# Patient Record
Sex: Male | Born: 1970 | Race: White | Hispanic: No | Marital: Single | Smoking: Current every day smoker
Health system: Southern US, Community
[De-identification: ages and names within clinical notes are randomized; demographics above are authoritative.]

## PROBLEM LIST (undated history)

## (undated) DIAGNOSIS — F32A Depression, unspecified: Secondary | ICD-10-CM

## (undated) DIAGNOSIS — F17200 Nicotine dependence, unspecified, uncomplicated: Secondary | ICD-10-CM

## (undated) DIAGNOSIS — K219 Gastro-esophageal reflux disease without esophagitis: Secondary | ICD-10-CM

## (undated) DIAGNOSIS — F329 Major depressive disorder, single episode, unspecified: Secondary | ICD-10-CM

## (undated) DIAGNOSIS — M549 Dorsalgia, unspecified: Secondary | ICD-10-CM

## (undated) DIAGNOSIS — F419 Anxiety disorder, unspecified: Secondary | ICD-10-CM

## (undated) DIAGNOSIS — G8929 Other chronic pain: Secondary | ICD-10-CM

## (undated) DIAGNOSIS — F319 Bipolar disorder, unspecified: Secondary | ICD-10-CM

## (undated) DIAGNOSIS — K589 Irritable bowel syndrome without diarrhea: Secondary | ICD-10-CM

## (undated) DIAGNOSIS — K5090 Crohn's disease, unspecified, without complications: Secondary | ICD-10-CM

## (undated) DIAGNOSIS — Z8719 Personal history of other diseases of the digestive system: Secondary | ICD-10-CM

## (undated) DIAGNOSIS — R109 Unspecified abdominal pain: Secondary | ICD-10-CM

## (undated) DIAGNOSIS — F1910 Other psychoactive substance abuse, uncomplicated: Secondary | ICD-10-CM

## (undated) DIAGNOSIS — R0602 Shortness of breath: Secondary | ICD-10-CM

## (undated) HISTORY — DX: Nicotine dependence, unspecified, uncomplicated: F17.200

## (undated) HISTORY — DX: Bipolar disorder, unspecified: F31.9

## (undated) HISTORY — PX: OTHER SURGICAL HISTORY: SHX169

## (undated) HISTORY — DX: Other psychoactive substance abuse, uncomplicated: F19.10

## (undated) HISTORY — PX: HERNIA REPAIR: SHX51

## (undated) HISTORY — DX: Unspecified abdominal pain: R10.9

## (undated) HISTORY — DX: Dorsalgia, unspecified: M54.9

## (undated) HISTORY — DX: Personal history of other diseases of the digestive system: Z87.19

---

## 2000-02-08 ENCOUNTER — Inpatient Hospital Stay (HOSPITAL_COMMUNITY): Admission: EM | Admit: 2000-02-08 | Discharge: 2000-02-11 | Payer: Self-pay | Admitting: *Deleted

## 2000-04-04 ENCOUNTER — Emergency Department (HOSPITAL_COMMUNITY): Admission: EM | Admit: 2000-04-04 | Discharge: 2000-04-04 | Payer: Self-pay | Admitting: Emergency Medicine

## 2001-02-09 ENCOUNTER — Encounter: Payer: Self-pay | Admitting: *Deleted

## 2001-02-09 ENCOUNTER — Inpatient Hospital Stay (HOSPITAL_COMMUNITY): Admission: EM | Admit: 2001-02-09 | Discharge: 2001-02-11 | Payer: Self-pay | Admitting: *Deleted

## 2001-02-19 ENCOUNTER — Encounter: Payer: Self-pay | Admitting: *Deleted

## 2001-02-19 ENCOUNTER — Inpatient Hospital Stay (HOSPITAL_COMMUNITY): Admission: EM | Admit: 2001-02-19 | Discharge: 2001-02-24 | Payer: Self-pay | Admitting: *Deleted

## 2001-02-20 ENCOUNTER — Encounter: Payer: Self-pay | Admitting: Urology

## 2001-02-21 ENCOUNTER — Encounter: Payer: Self-pay | Admitting: Urology

## 2001-02-24 ENCOUNTER — Encounter: Payer: Self-pay | Admitting: Urology

## 2001-10-02 ENCOUNTER — Encounter: Payer: Self-pay | Admitting: *Deleted

## 2001-10-02 ENCOUNTER — Emergency Department (HOSPITAL_COMMUNITY): Admission: EM | Admit: 2001-10-02 | Discharge: 2001-10-02 | Payer: Self-pay | Admitting: *Deleted

## 2002-02-08 ENCOUNTER — Emergency Department (HOSPITAL_COMMUNITY): Admission: EM | Admit: 2002-02-08 | Discharge: 2002-02-08 | Payer: Self-pay | Admitting: *Deleted

## 2002-02-08 ENCOUNTER — Encounter: Payer: Self-pay | Admitting: *Deleted

## 2002-02-08 ENCOUNTER — Emergency Department (HOSPITAL_COMMUNITY): Admission: EM | Admit: 2002-02-08 | Discharge: 2002-02-08 | Payer: Self-pay | Admitting: Emergency Medicine

## 2002-02-08 ENCOUNTER — Encounter: Payer: Self-pay | Admitting: Emergency Medicine

## 2002-02-16 ENCOUNTER — Encounter: Payer: Self-pay | Admitting: Emergency Medicine

## 2002-02-16 ENCOUNTER — Emergency Department (HOSPITAL_COMMUNITY): Admission: EM | Admit: 2002-02-16 | Discharge: 2002-02-16 | Payer: Self-pay | Admitting: Emergency Medicine

## 2002-05-20 ENCOUNTER — Emergency Department (HOSPITAL_COMMUNITY): Admission: EM | Admit: 2002-05-20 | Discharge: 2002-05-21 | Payer: Self-pay | Admitting: *Deleted

## 2002-05-21 ENCOUNTER — Encounter: Payer: Self-pay | Admitting: *Deleted

## 2002-05-22 ENCOUNTER — Encounter: Payer: Self-pay | Admitting: *Deleted

## 2002-05-22 ENCOUNTER — Emergency Department (HOSPITAL_COMMUNITY): Admission: EM | Admit: 2002-05-22 | Discharge: 2002-05-22 | Payer: Self-pay | Admitting: *Deleted

## 2002-05-23 ENCOUNTER — Emergency Department (HOSPITAL_COMMUNITY): Admission: EM | Admit: 2002-05-23 | Discharge: 2002-05-24 | Payer: Self-pay | Admitting: Family Medicine

## 2002-05-23 ENCOUNTER — Encounter: Payer: Self-pay | Admitting: *Deleted

## 2002-07-23 ENCOUNTER — Emergency Department (HOSPITAL_COMMUNITY): Admission: EM | Admit: 2002-07-23 | Discharge: 2002-07-23 | Payer: Self-pay | Admitting: Emergency Medicine

## 2003-02-11 ENCOUNTER — Emergency Department (HOSPITAL_COMMUNITY): Admission: EM | Admit: 2003-02-11 | Discharge: 2003-02-11 | Payer: Self-pay | Admitting: Emergency Medicine

## 2003-09-14 ENCOUNTER — Emergency Department (HOSPITAL_COMMUNITY): Admission: EM | Admit: 2003-09-14 | Discharge: 2003-09-14 | Payer: Self-pay | Admitting: Emergency Medicine

## 2003-09-18 ENCOUNTER — Emergency Department (HOSPITAL_COMMUNITY): Admission: EM | Admit: 2003-09-18 | Discharge: 2003-09-18 | Payer: Self-pay | Admitting: Emergency Medicine

## 2003-11-03 ENCOUNTER — Emergency Department (HOSPITAL_COMMUNITY): Admission: EM | Admit: 2003-11-03 | Discharge: 2003-11-03 | Payer: Self-pay | Admitting: *Deleted

## 2003-12-20 ENCOUNTER — Inpatient Hospital Stay (HOSPITAL_COMMUNITY): Admission: EM | Admit: 2003-12-20 | Discharge: 2003-12-22 | Payer: Self-pay | Admitting: Emergency Medicine

## 2003-12-24 ENCOUNTER — Emergency Department (HOSPITAL_COMMUNITY): Admission: EM | Admit: 2003-12-24 | Discharge: 2003-12-25 | Payer: Self-pay | Admitting: Emergency Medicine

## 2003-12-26 ENCOUNTER — Emergency Department (HOSPITAL_COMMUNITY): Admission: EM | Admit: 2003-12-26 | Discharge: 2003-12-26 | Payer: Self-pay | Admitting: Emergency Medicine

## 2004-01-28 ENCOUNTER — Emergency Department (HOSPITAL_COMMUNITY): Admission: EM | Admit: 2004-01-28 | Discharge: 2004-01-28 | Payer: Self-pay | Admitting: *Deleted

## 2004-03-14 ENCOUNTER — Emergency Department (HOSPITAL_COMMUNITY): Admission: EM | Admit: 2004-03-14 | Discharge: 2004-03-14 | Payer: Self-pay | Admitting: *Deleted

## 2004-08-27 ENCOUNTER — Emergency Department (HOSPITAL_COMMUNITY): Admission: EM | Admit: 2004-08-27 | Discharge: 2004-08-27 | Payer: Self-pay | Admitting: Emergency Medicine

## 2004-10-21 ENCOUNTER — Emergency Department (HOSPITAL_COMMUNITY): Admission: EM | Admit: 2004-10-21 | Discharge: 2004-10-21 | Payer: Self-pay | Admitting: Emergency Medicine

## 2005-08-31 ENCOUNTER — Emergency Department (HOSPITAL_COMMUNITY): Admission: EM | Admit: 2005-08-31 | Discharge: 2005-08-31 | Payer: Self-pay | Admitting: Emergency Medicine

## 2005-09-17 ENCOUNTER — Emergency Department (HOSPITAL_COMMUNITY): Admission: EM | Admit: 2005-09-17 | Discharge: 2005-09-18 | Payer: Self-pay | Admitting: Emergency Medicine

## 2005-09-27 ENCOUNTER — Emergency Department (HOSPITAL_COMMUNITY): Admission: EM | Admit: 2005-09-27 | Discharge: 2005-09-27 | Payer: Self-pay | Admitting: Emergency Medicine

## 2005-11-18 ENCOUNTER — Emergency Department (HOSPITAL_COMMUNITY): Admission: EM | Admit: 2005-11-18 | Discharge: 2005-11-18 | Payer: Self-pay | Admitting: Emergency Medicine

## 2005-12-12 ENCOUNTER — Emergency Department (HOSPITAL_COMMUNITY): Admission: EM | Admit: 2005-12-12 | Discharge: 2005-12-12 | Payer: Self-pay | Admitting: Emergency Medicine

## 2006-06-30 ENCOUNTER — Emergency Department (HOSPITAL_COMMUNITY): Admission: EM | Admit: 2006-06-30 | Discharge: 2006-07-01 | Payer: Self-pay | Admitting: Emergency Medicine

## 2006-07-03 ENCOUNTER — Emergency Department (HOSPITAL_COMMUNITY): Admission: EM | Admit: 2006-07-03 | Discharge: 2006-07-03 | Payer: Self-pay | Admitting: Emergency Medicine

## 2006-08-23 ENCOUNTER — Inpatient Hospital Stay (HOSPITAL_COMMUNITY): Admission: EM | Admit: 2006-08-23 | Discharge: 2006-08-27 | Payer: Self-pay | Admitting: Emergency Medicine

## 2006-08-26 ENCOUNTER — Ambulatory Visit: Payer: Self-pay | Admitting: Internal Medicine

## 2006-08-29 ENCOUNTER — Ambulatory Visit: Payer: Self-pay | Admitting: Internal Medicine

## 2006-09-10 ENCOUNTER — Ambulatory Visit: Payer: Self-pay | Admitting: Internal Medicine

## 2006-09-24 ENCOUNTER — Ambulatory Visit (HOSPITAL_COMMUNITY): Admission: RE | Admit: 2006-09-24 | Discharge: 2006-09-24 | Payer: Self-pay | Admitting: Internal Medicine

## 2006-10-31 ENCOUNTER — Emergency Department (HOSPITAL_COMMUNITY): Admission: EM | Admit: 2006-10-31 | Discharge: 2006-10-31 | Payer: Self-pay | Admitting: Emergency Medicine

## 2006-11-05 ENCOUNTER — Ambulatory Visit: Payer: Self-pay | Admitting: Internal Medicine

## 2006-12-18 ENCOUNTER — Ambulatory Visit: Payer: Self-pay | Admitting: Internal Medicine

## 2007-03-11 ENCOUNTER — Inpatient Hospital Stay (HOSPITAL_COMMUNITY): Admission: EM | Admit: 2007-03-11 | Discharge: 2007-03-13 | Payer: Self-pay | Admitting: Emergency Medicine

## 2007-03-12 ENCOUNTER — Ambulatory Visit: Payer: Self-pay | Admitting: Gastroenterology

## 2007-03-13 ENCOUNTER — Ambulatory Visit: Payer: Self-pay | Admitting: Internal Medicine

## 2007-04-03 ENCOUNTER — Emergency Department (HOSPITAL_COMMUNITY): Admission: EM | Admit: 2007-04-03 | Discharge: 2007-04-03 | Payer: Self-pay | Admitting: Emergency Medicine

## 2007-04-22 ENCOUNTER — Ambulatory Visit: Payer: Self-pay | Admitting: Family Medicine

## 2007-04-28 DIAGNOSIS — F319 Bipolar disorder, unspecified: Secondary | ICD-10-CM | POA: Insufficient documentation

## 2007-04-28 DIAGNOSIS — F1910 Other psychoactive substance abuse, uncomplicated: Secondary | ICD-10-CM

## 2007-04-28 DIAGNOSIS — M549 Dorsalgia, unspecified: Secondary | ICD-10-CM | POA: Insufficient documentation

## 2007-04-28 DIAGNOSIS — R109 Unspecified abdominal pain: Secondary | ICD-10-CM | POA: Insufficient documentation

## 2007-04-28 DIAGNOSIS — Z8719 Personal history of other diseases of the digestive system: Secondary | ICD-10-CM | POA: Insufficient documentation

## 2007-04-28 DIAGNOSIS — F17200 Nicotine dependence, unspecified, uncomplicated: Secondary | ICD-10-CM

## 2007-05-08 ENCOUNTER — Ambulatory Visit: Payer: Self-pay | Admitting: Family Medicine

## 2007-05-31 ENCOUNTER — Emergency Department (HOSPITAL_COMMUNITY): Admission: EM | Admit: 2007-05-31 | Discharge: 2007-06-01 | Payer: Self-pay | Admitting: Emergency Medicine

## 2007-06-17 ENCOUNTER — Ambulatory Visit: Payer: Self-pay | Admitting: Family Medicine

## 2007-07-26 ENCOUNTER — Emergency Department (HOSPITAL_COMMUNITY): Admission: EM | Admit: 2007-07-26 | Discharge: 2007-07-27 | Payer: Self-pay | Admitting: Emergency Medicine

## 2007-07-29 ENCOUNTER — Emergency Department (HOSPITAL_COMMUNITY): Admission: EM | Admit: 2007-07-29 | Discharge: 2007-07-29 | Payer: Self-pay | Admitting: Emergency Medicine

## 2007-08-12 ENCOUNTER — Ambulatory Visit: Payer: Self-pay | Admitting: Family Medicine

## 2007-08-12 ENCOUNTER — Emergency Department (HOSPITAL_COMMUNITY): Admission: EM | Admit: 2007-08-12 | Discharge: 2007-08-12 | Payer: Self-pay | Admitting: Emergency Medicine

## 2007-08-12 ENCOUNTER — Encounter: Payer: Self-pay | Admitting: Family Medicine

## 2007-08-18 ENCOUNTER — Telehealth: Payer: Self-pay | Admitting: Family Medicine

## 2007-08-19 ENCOUNTER — Encounter: Payer: Self-pay | Admitting: Family Medicine

## 2007-08-27 ENCOUNTER — Encounter: Payer: Self-pay | Admitting: Family Medicine

## 2007-10-20 ENCOUNTER — Telehealth: Payer: Self-pay | Admitting: Family Medicine

## 2007-10-30 ENCOUNTER — Telehealth (INDEPENDENT_AMBULATORY_CARE_PROVIDER_SITE_OTHER): Payer: Self-pay | Admitting: *Deleted

## 2007-10-31 ENCOUNTER — Telehealth: Payer: Self-pay | Admitting: Family Medicine

## 2007-11-03 ENCOUNTER — Encounter: Payer: Self-pay | Admitting: Family Medicine

## 2007-11-04 ENCOUNTER — Encounter: Payer: Self-pay | Admitting: Family Medicine

## 2007-11-10 ENCOUNTER — Emergency Department (HOSPITAL_COMMUNITY): Admission: EM | Admit: 2007-11-10 | Discharge: 2007-11-11 | Payer: Self-pay | Admitting: Emergency Medicine

## 2007-11-11 ENCOUNTER — Encounter: Payer: Self-pay | Admitting: Family Medicine

## 2007-12-01 ENCOUNTER — Ambulatory Visit: Payer: Self-pay | Admitting: Family Medicine

## 2007-12-12 ENCOUNTER — Telehealth: Payer: Self-pay | Admitting: Family Medicine

## 2007-12-12 ENCOUNTER — Encounter: Payer: Self-pay | Admitting: Family Medicine

## 2007-12-15 ENCOUNTER — Telehealth: Payer: Self-pay | Admitting: Family Medicine

## 2007-12-17 ENCOUNTER — Telehealth: Payer: Self-pay | Admitting: Family Medicine

## 2007-12-18 ENCOUNTER — Ambulatory Visit (HOSPITAL_COMMUNITY): Admission: RE | Admit: 2007-12-18 | Discharge: 2007-12-18 | Payer: Self-pay | Admitting: Family Medicine

## 2007-12-19 ENCOUNTER — Emergency Department (HOSPITAL_COMMUNITY): Admission: EM | Admit: 2007-12-19 | Discharge: 2007-12-19 | Payer: Self-pay | Admitting: Emergency Medicine

## 2008-01-22 ENCOUNTER — Encounter: Payer: Self-pay | Admitting: Family Medicine

## 2008-01-23 ENCOUNTER — Encounter: Payer: Self-pay | Admitting: Family Medicine

## 2008-03-31 ENCOUNTER — Ambulatory Visit: Payer: Self-pay | Admitting: Family Medicine

## 2008-09-15 ENCOUNTER — Encounter: Payer: Self-pay | Admitting: Family Medicine

## 2008-09-23 ENCOUNTER — Ambulatory Visit: Payer: Self-pay | Admitting: Family Medicine

## 2008-10-03 DIAGNOSIS — K219 Gastro-esophageal reflux disease without esophagitis: Secondary | ICD-10-CM | POA: Insufficient documentation

## 2008-10-03 DIAGNOSIS — F411 Generalized anxiety disorder: Secondary | ICD-10-CM

## 2008-10-07 ENCOUNTER — Telehealth: Payer: Self-pay | Admitting: Family Medicine

## 2008-10-08 ENCOUNTER — Telehealth: Payer: Self-pay | Admitting: Family Medicine

## 2008-10-19 ENCOUNTER — Telehealth: Payer: Self-pay | Admitting: Family Medicine

## 2008-11-08 ENCOUNTER — Telehealth: Payer: Self-pay | Admitting: Family Medicine

## 2008-11-08 ENCOUNTER — Ambulatory Visit: Payer: Self-pay | Admitting: Family Medicine

## 2008-11-16 ENCOUNTER — Telehealth: Payer: Self-pay | Admitting: Family Medicine

## 2009-01-27 ENCOUNTER — Ambulatory Visit: Payer: Self-pay | Admitting: Family Medicine

## 2009-02-13 IMAGING — CR DG CHEST 2V
2 series · 2 of 2 positions shown · non-contrast
Comparison: 10/21/2004

CLINICAL DATA: Left chest pain, fell from ladder last week, pain
with breathing

CHEST - 2 VIEW

[view not recorded (1 of 2)]
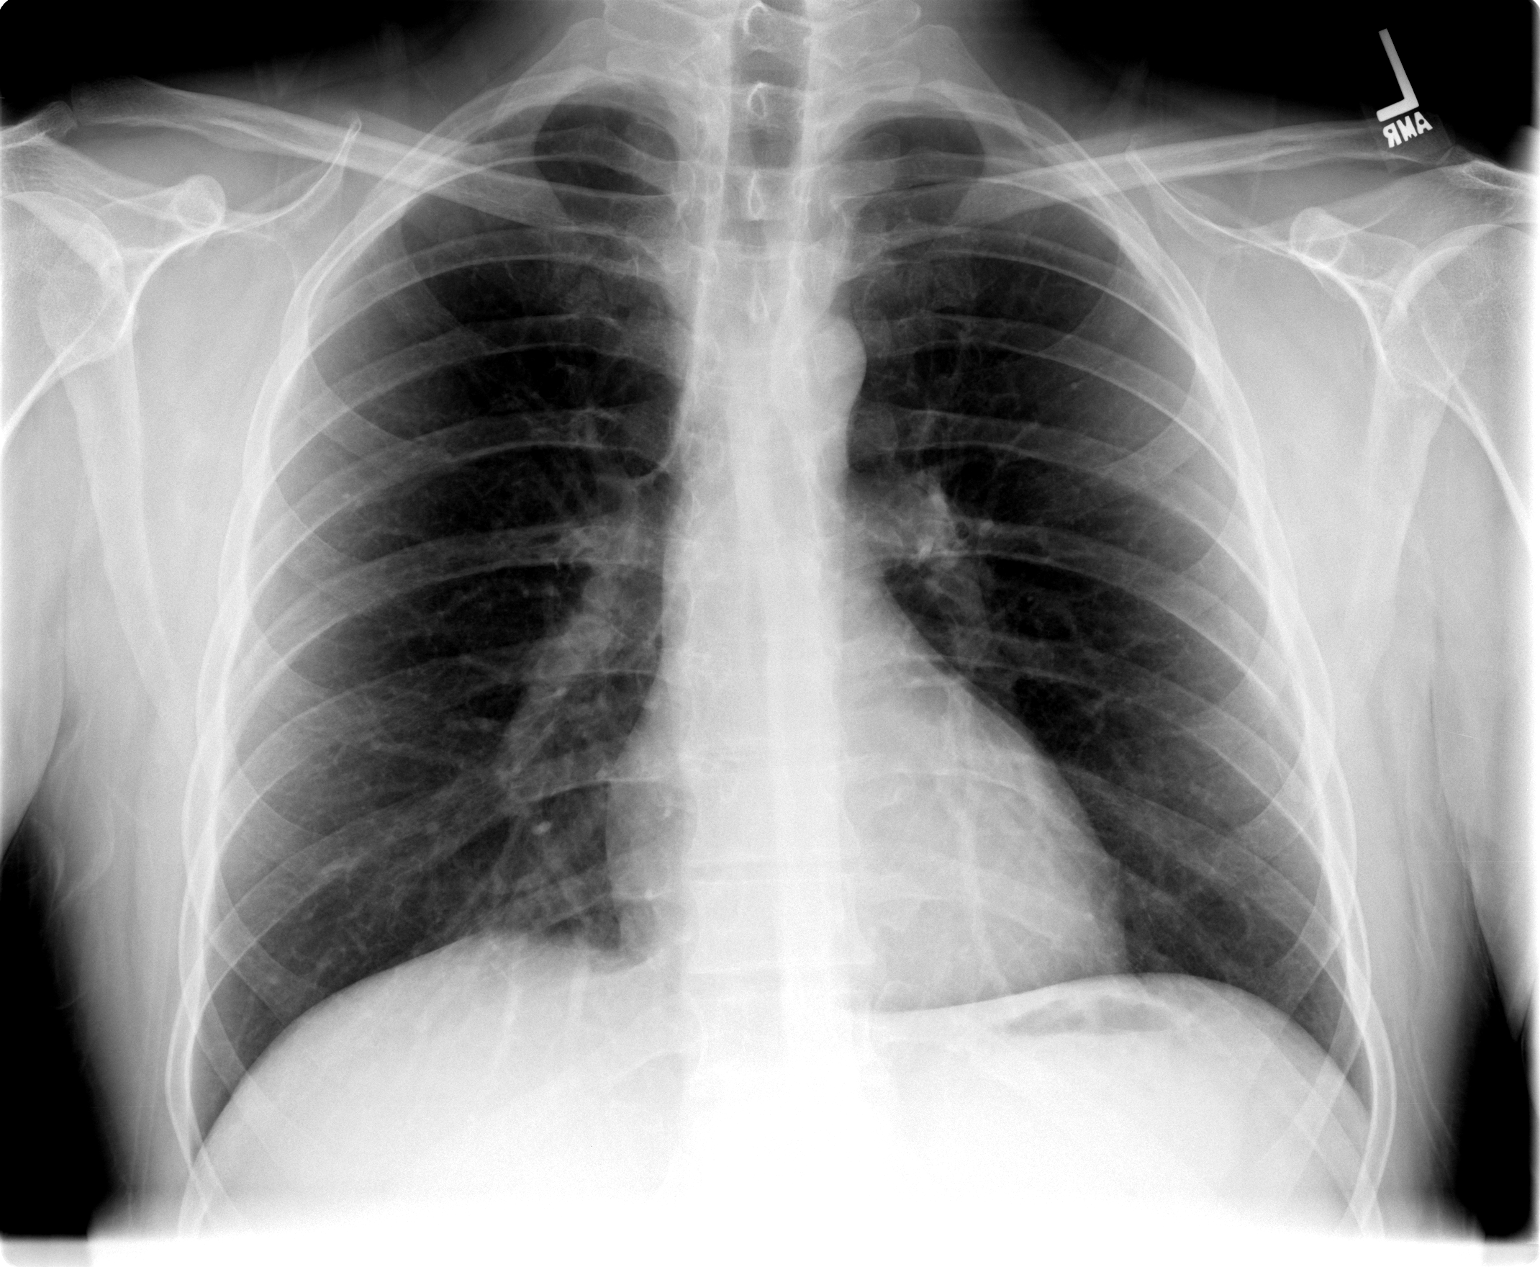

[view not recorded (2 of 2)]
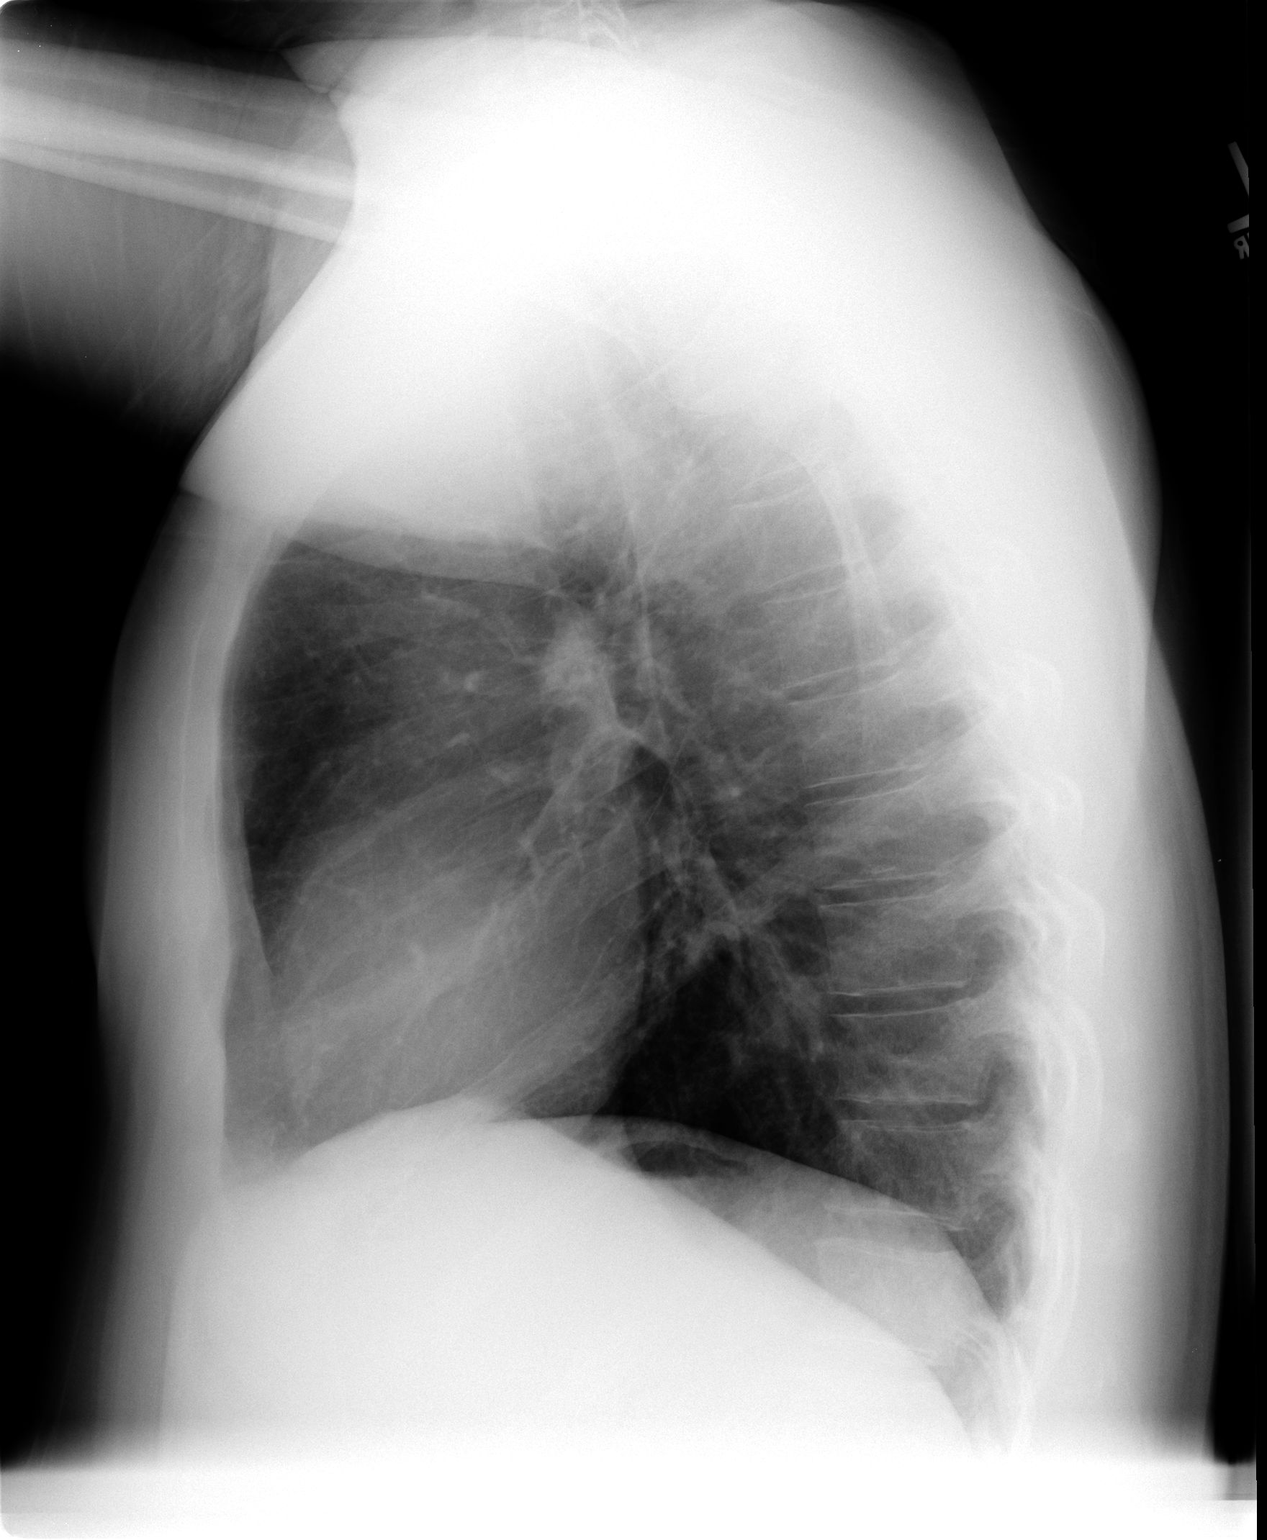

[2 of 2 positions shown; findings below may reference images not displayed]

FINDINGS: Normal heart size, mediastinal contours, and pulmonary vascularity.
Bronchitic changes without pulmonary infiltrate or pleural
effusion.
No pneumothorax.
No definite fractures identified.
IMPRESSION: Mild chronic bronchitic changes.
No acute abnormalities.

## 2009-02-25 ENCOUNTER — Encounter: Payer: Self-pay | Admitting: Family Medicine

## 2009-07-20 ENCOUNTER — Ambulatory Visit: Payer: Self-pay | Admitting: Family Medicine

## 2009-10-14 ENCOUNTER — Telehealth (INDEPENDENT_AMBULATORY_CARE_PROVIDER_SITE_OTHER): Payer: Self-pay | Admitting: *Deleted

## 2009-11-01 ENCOUNTER — Telehealth: Payer: Self-pay | Admitting: Family Medicine

## 2009-11-08 ENCOUNTER — Telehealth: Payer: Self-pay | Admitting: Family Medicine

## 2009-11-24 ENCOUNTER — Ambulatory Visit: Payer: Self-pay | Admitting: Family Medicine

## 2009-12-31 ENCOUNTER — Emergency Department (HOSPITAL_COMMUNITY)
Admission: EM | Admit: 2009-12-31 | Discharge: 2010-01-01 | Payer: Self-pay | Source: Home / Self Care | Admitting: Emergency Medicine

## 2010-01-04 ENCOUNTER — Encounter: Payer: Self-pay | Admitting: Family Medicine

## 2010-01-04 ENCOUNTER — Ambulatory Visit
Admission: RE | Admit: 2010-01-04 | Discharge: 2010-01-04 | Payer: Self-pay | Source: Home / Self Care | Attending: Family Medicine | Admitting: Family Medicine

## 2010-01-04 DIAGNOSIS — K089 Disorder of teeth and supporting structures, unspecified: Secondary | ICD-10-CM | POA: Insufficient documentation

## 2010-01-05 ENCOUNTER — Telehealth: Payer: Self-pay | Admitting: Family Medicine

## 2010-02-09 NOTE — Assessment & Plan Note (Signed)
Summary: office visit   Vital Signs:  Patient profile:   40 year old male Height:      73 inches Weight:      226.50 pounds BMI:     29.99 O2 Sat:      97 % Pulse rate:   116 / minute Pulse rhythm:   regular Resp:     16 per minute BP sitting:   118 / 88  (left arm) Cuff size:   large  Vitals Entered By: Everitt Amber LPN (July 20, 2009 12:57 PM)  Nutrition Counseling: Patient's BMI is greater than 25 and therefore counseled on weight management options. CC: has been having pain in his left upper leg and also would like labs drawn and also a chest xray because he has been having a pain under his left arm    Primary Care Provider:  Syliva Overman MD  CC:  has been having pain in his left upper leg and also would like labs drawn and also a chest xray because he has been having a pain under his left arm .  History of Present Illness: Pt concererned that he needs a GI eval , concerned about colon polyps potentially and wants  to be checked for cancer. Still smoking cigarretes, rarely drinks alcohol. States he has no marijuana in his system. He denies any recent fever or chills. He has a dry smoker's cough, he denies nasal presure or drainage, sinus congestion, sopre throat or productive cogh. He continues to experience chronic anxiety, he denies symptoms of uncontrolled depression or mood instability  or hallucinations. He has gained a significant amt of weight , but intends to lose it over the Summer esp since he is working  [employer]  Alcohol-Tobacco     Smoking Cessation Counseling: yes  Current Medications (verified): 1)  Xanax 1 Mg  Tabs (Alprazolam) .... One By Mouth Two Times A Day 2)  Proventil Hfa 108 (90 Base) Mcg/act  Aers (Albuterol Sulfate) .... Use As Needed For Shortness of Breath 3)  Vicodin Hp 10-660 Mg Tabs (Hydrocodone-Acetaminophen) .... Take 1 Tablet By Mouth Two Times A Day  Allergies (verified): No Known Drug  Allergies  Review of Systems      See HPI General:  Complains of fatigue. Eyes:  Denies blurring, discharge, and eye pain. CV:  Denies chest pain or discomfort, palpitations, and swelling of feet. GI:  Denies abdominal pain, constipation, diarrhea, nausea, and vomiting. GU:  Denies dysuria and urinary frequency. MS:  Complains of low back pain and mid back pain; chronic and unchanged. Derm:  Denies itching and rash. Heme:  Denies abnormal bruising and bleeding. Allergy:  Complains of seasonal allergies.  Physical Exam  General:  Well-developed,well-nourished,in no acute distress; alert,appropriate and cooperative throughout examination HEENT: No facial asymmetry,  EOMI, No sinus tenderness, TM's Clear, oropharynx  pink and moist. Poor oral hygiene  Chest: Clear to auscultation bilaterally. Decreased air entry bilaterally CVS: S1, S2, No murmurs, No S3.   Abd: Soft, Nontender.  MS: Adequate ROM spine, hips, shoulders and knees.  Ext: No edema.   CNS: CN 2-12 intact, power tone and sensation normal throughout.   Skin: Intact, no visible lesions or rashes.  Psych: Good eye contact, normal affect.  Memory intact, not anxious or depressed appearing.    Impression & Recommendations:  Problem # 1:  GENERALIZED ANXIETY DISORDER (ICD-300.02) Assessment Improved  His updated medication list for this problem includes:    Xanax 1 Mg Tabs (Alprazolam) .Marland KitchenMarland KitchenMarland KitchenMarland Kitchen  One by mouth two times a day  Problem # 2:  GERD (ICD-530.81) Assessment: Unchanged  The following medications were removed from the medication list:    Protonix 40 Mg Tbec (Pantoprazole sodium) ..... One tab by mouth once daily    Zegerid 40-1100 Mg Caps (Omeprazole-sodium bicarbonate) .Marland Kitchen... Take 1 tablet by mouth once a day  Problem # 3:  ABDOMINAL PAIN, CHRONIC (ICD-789.00) Assessment: Unchanged will call back for GI eval;, question of who will actually follow him however  Problem # 4:  NICOTINE ADDICTION  (ICD-305.1) Assessment: Unchanged  Encouraged smoking cessation and discussed different methods for smoking cessation.   Problem # 5:  BACK PAIN, CHRONIC (ICD-724.5) Assessment: Unchanged  The following medications were removed from the medication list:    Zanaflex 4 Mg Tabs (Tizanidine hcl) ..... One tab by mouth every 12 hours as needed His updated medication list for this problem includes:    Vicodin Hp 10-660 Mg Tabs (Hydrocodone-acetaminophen) .Marland Kitchen... Take 1 tablet by mouth two times a day  Complete Medication List: 1)  Xanax 1 Mg Tabs (Alprazolam) .... One by mouth two times a day 2)  Proventil Hfa 108 (90 Base) Mcg/act Aers (Albuterol sulfate) .... Use as needed for shortness of breath 3)  Vicodin Hp 10-660 Mg Tabs (Hydrocodone-acetaminophen) .... Take 1 tablet by mouth two times a day  Patient Instructions: 1)  Please schedule a follow-up appointment in 4 months. 2)  Tobacco is very bad for your health and your loved ones! You Should stop smoking!. 3)  Stop Smoking Tips: Choose a Quit date. Cut down before the Quit date. decide what you will do as a substitute when you feel the urge to smoke(gum,toothpick,exercise). 4)  It is important that you exercise regularly at least 20 minutes 5 times a week. If you develop chest pain, have severe difficulty breathing, or feel very tired , stop exercising immediately and seek medical attention. 5)  You need to lose weight. Consider a lower calorie diet and regular exercise.  Prescriptions: PROVENTIL HFA 108 (90 BASE) MCG/ACT  AERS (ALBUTEROL SULFATE) use as needed for shortness of breath  #18 Each x 1   Entered by:   Everitt Amber LPN   Authorized by:   Syliva Overman MD   Signed by:   Everitt Amber LPN on 16/10/9602   Method used:   Printed then faxed to ...       [facility]* (retail)       [address]       [place], [place]  [address]       Ph: [phone]       Fax: [phone]   RxID:    [id number] VICODIN HP 10-660 MG TABS (HYDROCODONE-ACETAMINOPHEN) Take 1 tablet by mouth two times a day  #60 x 0   Entered by:   Everitt Amber LPN   Authorized by:   Syliva Overman MD   Signed by:   Everitt Amber LPN on 11/04/2534   Method used:   Printed then faxed to ...       [facility]* (retail)       [address]       [place], [place]  [address]       Ph: [phone]       Fax: [phone]   RxID:   [id number] Prudy Feeler 1 MG  TABS (ALPRAZOLAM) one by mouth two times a  day  #60 x 0   Entered by:   Everitt Amber LPN   Authorized by:   Syliva Overman MD   Signed by:   Everitt Amber LPN on 16/10/9602   Method used:   Printed then faxed to ...       [facility]* (retail)       [address]       [place], [place]  [address]       Ph: [phone]       Fax: [phone]   RxID:   [id number]

## 2010-02-09 NOTE — Letter (Signed)
Summary: medicine  medicine   Imported By: Lind Guest 01/04/2010 17:00:32  _____________________________________________________________________  External Attachment:    Type:   Image     Comment:   External Document

## 2010-02-09 NOTE — Assessment & Plan Note (Signed)
Summary: follow up/slj   Vital Signs:  Patient profile:   40 year old male Height:      73 inches Weight:      219.25 pounds BMI:     29.03 O2 Sat:      97 % on Room air Pulse rate:   101 / minute Pulse rhythm:   regular Resp:     16 per minute BP sitting:   122 / 82  (left arm)  Vitals Entered By: Mauricia Area CMA (November 24, 2009 3:18 PM)  Nutrition Counseling: Patient's BMI is greater than 25 and therefore counseled on weight management options.  O2 Flow:  Room air CC: Chronic lower back pain Comments Did not bring meds   Primary Care Rozlyn Yerby:  Syliva Overman MD  CC:  Chronic lower back pain.  History of Present Illness: Reports  that he has been doing fairly well. Denies recent fever or chills. Denies sinus pressure, nasal congestion , ear pain or sore throat. Denies chest congestion, or cough productive of sputum. Denies chest pain, palpitations, PND, orthopnea or leg swelling. Denies abdominal pain, nausea, vomitting, diarrhea or constipation. Denies change in bowel movements or bloody stool. Denies dysuria , frequency, incontinence or hesitancyc/o chronic back pain with reduced mobility. Denies  joint pain, swelling, or reduced mobility. Denies headaches, vertigo, seizures. Denies depression, anxiety or insomnia. Denies  rash, lesions, or itch.     Allergies (verified): No Known Drug Allergies  Review of Systems      See HPI Eyes:  Denies discharge, eye pain, and red eye. MS:  Complains of low back pain and mid back pain. Psych:  Complains of mental problems; denies anxiety, easily angered, suicidal thoughts/plans, thoughts of violence, and unusual visions or sounds. Endo:  Denies cold intolerance, excessive hunger, and excessive thirst. Heme:  Denies abnormal bruising and bleeding. Allergy:  Denies hives or rash and itching eyes.  Physical Exam  General:  Well-developed,well-nourished,in no acute distress; alert,appropriate and cooperative  throughout examination HEENT: No facial asymmetry,  EOMI, No sinus tenderness, TM's Clear, oropharynx  pink and moist.   Chest: Clear to auscultation bilaterally.  CVS: S1, S2, No murmurs, No S3.   Abd: Soft, Nontender.  MS: Adequate ROM spine, hips, shoulders and knees.  Ext: No edema.   CNS: CN 2-12 intact, power tone and sensation normal throughout.   Skin: Intact, no visible lesions or rashes.  Psych: Good eye contact, normal affect.  Memory intact, not anxious or depressed appearing.    Impression & Recommendations:  Problem # 1:  GENERALIZED ANXIETY DISORDER (ICD-300.02) Assessment Improved  His updated medication list for this problem includes:    Xanax 1 Mg Tabs (Alprazolam) ..... One by mouth two times a day  Problem # 2:  GERD (ICD-530.81) Assessment: Unchanged  Problem # 3:  BACK PAIN, CHRONIC (ICD-724.5) Assessment: Unchanged  His updated medication list for this problem includes:    Vicodin Hp 10-660 Mg Tabs (Hydrocodone-acetaminophen) .Marland Kitchen... Take 1 tablet by mouth two times a day  Problem # 4:  NICOTINE ADDICTION (ICD-305.1) Assessment: Unchanged  Encouraged smoking cessation and discussed different methods for smoking cessation.   Complete Medication List: 1)  Xanax 1 Mg Tabs (Alprazolam) .... One by mouth two times a day 2)  Proventil Hfa 108 (90 Base) Mcg/act Aers (Albuterol sulfate) .... Use as needed for shortness of breath 3)  Vicodin Hp 10-660 Mg Tabs (Hydrocodone-acetaminophen) .... Take 1 tablet by mouth two times a day  Other Orders: T-Basic Metabolic  Panel [phone]) T-Lipid Profile [phone]) T-CBC w/Diff [id number]) T-TSH [phone]) T- Hemoglobin A1C (28413-24401) Influenza Vaccine NON MCR (02725)  Patient Instructions: 1)  Please schedule a  CPE in 4 to 4.5 months. 2)  no med changes at this time 3)  fasting labs asap 4)  BMP prior to visit, ICD-9: 5)  Lipid Panel prior to visit, ICD-9: 6)  TSH prior to visit, ICD-9: 7)   CBC w/ Diff prior to visit, ICD-9: 8)  hBA1c 9)  Flu vac today 10)  no med changes Prescriptions: VICODIN HP 10-660 MG TABS (HYDROCODONE-ACETAMINOPHEN) Take 1 tablet by mouth two times a day  #60 x 4   Entered by:   Adella Hare LPN   Authorized by:   Syliva Overman MD   Signed by:   Adella Hare LPN on 36/64/4034   Method used:   Printed then faxed to ...       [facility]* (retail)       [address]       [place], [place]  [address]       Ph: [phone]       Fax: [phone]   RxID:   [id number] PROVENTIL HFA 108 (90 BASE) MCG/ACT  AERS (ALBUTEROL SULFATE) use as needed for shortness of breath  #18 Each x 4   Entered by:   Adella Hare LPN   Authorized by:   Syliva Overman MD   Signed by:   Adella Hare LPN on 32/20/2542   Method used:   Printed then faxed to ...       [facility]* (retail)       [address]       [place], [place]  [address]       Ph: [phone]       Fax: [phone]   RxID:   [id number] Prudy Feeler 1 MG  TABS (ALPRAZOLAM) one by mouth two times a day  #60 x 4   Entered by:   Adella Hare LPN   Authorized by:   Syliva Overman MD   Signed by:   Adella Hare LPN on 81/82/9937   Method used:   Printed then faxed to ...       [facility]* (retail)       [address]       [place], [place]  [address]       Ph: [phone]       Fax: [phone]   RxID:   [id number]    Orders Added: 1)  Est. Patient Level IV [67619] 2)  T-Basic Metabolic Panel [phone] 3)  T-Lipid Profile [80061-22930] 4)  T-CBC w/Diff [58099-83382] 5)  T-TSH [50539-76734] 6)  T- Hemoglobin A1C [83036-23375] 7)  Influenza Vaccine NON MCR [00028]   Immunizations Administered:  Influenza Vaccine # 1:    Vaccine Type: Fluvax Non-MCR    Site: left deltoid    Mfr: novartis    Dose: 0.5 ml    Route: IM    Given by: Adella Hare LPN    Exp. Date:  05/2010    Lot #: [id number]    VIS given: 08/02/09 version given November 24, 2009.   Immunizations Administered:  Influenza Vaccine # 1:    Vaccine Type: Fluvax Non-MCR    Site: left deltoid    Mfr: novartis    Dose: 0.5 ml  Route: IM    Given by: Adella Hare LPN    Exp. Date: 05/2010    Lot #: [id number]    VIS given: 08/02/09 version given November 24, 2009.

## 2010-02-09 NOTE — Progress Notes (Signed)
Summary: samples  Phone Note Call from Patient   Summary of Call: patient would like to know if we have any samples of zegerid 40-110 mg or protonix 40mg , he doesn't have any insurance and can't really afford the otc medication .  Please advise Initial call taken by: Curtis Sites,  November 01, 2009 3:58 PM  Follow-up for Phone Call        RETURNED CALL, LEFT MESSAGE  NO SAMPLES OF WHAT HE IS REQUESTING BUT WE DO HAVE PREVACID SAMPLES AVAILABLE Follow-up by: Adella Hare LPN,  November 02, 2009 11:41 AM  Additional Follow-up for Phone Call Additional follow up Details #1::        PATIENT AWARE Additional Follow-up by: Adella Hare LPN,  November 04, 2009 3:22 PM

## 2010-02-09 NOTE — Letter (Signed)
Summary: medical release  medical release   Imported By: Lind Guest 02/25/2009 15:22:23  _____________________________________________________________________  External Attachment:    Type:   Image     Comment:   External Document

## 2010-02-09 NOTE — Progress Notes (Signed)
Summary: BROKE TOE  Phone Note Call from Patient   Summary of Call: BROKE TOE WANTS YOU TO CALL ASAP AT [phone] Initial call taken by: Lind Guest,  November 08, 2009 10:56 AM  Follow-up for Phone Call        wants to know if he can more of his pain med, states his toe is hurting him badly and his body is used to the pain med dose he is on so its not helping the toe pain Follow-up by: Adella Hare LPN,  November 08, 2009 11:02 AM  Additional Follow-up for Phone Call Additional follow up Details #1::        sorry to hear, no change in pain meds however, use tylenol 1 twice daily or ibuprofen otc one twice daily for next 5 days to help with pain Additional Follow-up by: Syliva Overman MD,  November 08, 2009 12:40 PM    Additional Follow-up for Phone Call Additional follow up Details #2::    patient aware Follow-up by: Adella Hare LPN,  November 08, 2009 1:51 PM

## 2010-02-09 NOTE — Progress Notes (Signed)
Summary: LEFT MESSAGE  Phone Note Call from Patient   Summary of Call: LEFT A MESSAGE AT LUNCH I CALLED THE # BACK AND MOTHER GAVE ME ANOTHER # [phone] AND HE JUST LEFT THERE Initial call taken by: Lind Guest,  October 14, 2009 1:07 PM

## 2010-02-09 NOTE — Assessment & Plan Note (Signed)
Summary: office visit   Vital Signs:  Patient profile:   40 year old male Height:      73 inches Weight:      204.50 pounds BMI:     27.08 O2 Sat:      94 % Pulse rate:   98 / minute Pulse rhythm:   regular Resp:     16 per minute BP sitting:   122 / 80 Cuff size:   large  Vitals Entered By: Everitt Amber (January 27, 2009 4:19 PM)  Nutrition Counseling: Patient's BMI is greater than 25 and therefore counseled on weight management options. CC: has been having hot flashes, said he stays hot all the time. Wants to go back to the gastro doctor also. Really fatigued all the time too   Primary Care Provider:  Syliva Overman MD  CC:  has been having hot flashes and said he stays hot all the time. Wants to go back to the gastro doctor also. Really fatigued all the time too.  History of Present Illness: The pt reports that he has been having excessive hot flashes, and often feels fatigued. He reports chronic abdominal pain, and mentioned to the nurse wNTING TO SEE gi AGAIN FOR THIS THO HE DID NOT FOLLOW THRU ON THIS EQUEST DURING MY INERVIEW. tHERE IS NO CURRENT DYSPHAGIA, CHANGE IN BOWEL MOVEMENTS, NAUSEA OR VOMITTNG. He denies any recent mental breakdown. He is still smoking and reports less alcohol use.  Preventive Screening-Counseling & Management  Alcohol-Tobacco     Smoking Cessation Counseling: yes  Current Medications (verified): 1)  Protonix 40 Mg  Tbec (Pantoprazole Sodium) .... One Tab By Mouth Once Daily 2)  Xanax 1 Mg  Tabs (Alprazolam) .... One By Mouth Two Times A Day 3)  Zanaflex 4 Mg  Tabs (Tizanidine Hcl) .... One Tab By Mouth Every 12 Hours As Needed 4)  Proventil Hfa 108 (90 Base) Mcg/act  Aers (Albuterol Sulfate) .... Use As Needed For Shortness of Breath 5)  Vicodin Es 7.5-750 Mg  Tabs (Hydrocodone-Acetaminophen) .... Take 1 Tablet By Mouth Two Times A Day 6)  Sudal 12 Tannate 4-30 Mg Chew (Chlorphen Mal-Pse Hcl Tannate) .... Chew One Tab Every 12 Hours 7)   Zegerid 40-1100 Mg Caps (Omeprazole-Sodium Bicarbonate) .... Take 1 Tablet By Mouth Once A Day  Allergies (verified): No Known Drug Allergies  Review of Systems      See HPI General:  Complains of fatigue and sweats; denies chills and fever. Eyes:  Denies discharge and red eye. ENT:  Denies hoarseness, nasal congestion, and sinus pressure. CV:  Denies chest pain or discomfort and palpitations. Resp:  Complains of cough; denies sputum productive. GI:  Complains of abdominal pain. GU:  Denies dysuria, nocturia, and urinary frequency. Psych:  Complains of anxiety, depression, irritability, and mental problems; denies suicidal thoughts/plans, thoughts of violence, and unusual visions or sounds.  Physical Exam  General:  alert, well-nourished, and well-hydrated.   HEENT: No facial asymmetry,  EOMI, No sinus tenderness, TM's Clear, oropharynx  pink and moist. mild hyperemia of left inner jawPoor dentition, with caries  Chest: Clear to auscultation bilaterally.  CVS: S1, S2, No murmurs, No S3.   Abd: Soft, mild epigastric tenderness MS: decreased ROM spine,adequate in hips, shoulders and knees.  Ext: No edema.   CNS: CN 2-12 intact, power tone and sensation normal throughout.   Skin: Intact, no visible lesions or rashes.  Psych: good eye contact, not depressed but anxious appearing   Impression & Recommendations:  Problem # 1:  GENERALIZED ANXIETY DISORDER (ICD-300.02) Assessment Unchanged  His updated medication list for this problem includes:    Xanax 1 Mg Tabs (Alprazolam) ..... One by mouth two times a day  Problem # 2:  GERD (ICD-530.81) Assessment: Unchanged  His updated medication list for this problem includes:    Protonix 40 Mg Tbec (Pantoprazole sodium) ..... One tab by mouth once daily  oR    Zegerid 40-1100 Mg Caps (Omeprazole-sodium bicarbonate) .Marland Kitchen... Take 1 tablet by mouth once a day depends on availability  Problem # 3:  BACK PAIN, CHRONIC  (ICD-724.5) Assessment: Deteriorated  The following medications were removed from the medication list:    Vicodin Es 7.5-750 Mg Tabs (Hydrocodone-acetaminophen) .Marland Kitchen... Take 1 tablet by mouth two times a day His updated medication list for this problem includes:    Zanaflex 4 Mg Tabs (Tizanidine hcl) ..... One tab by mouth every 12 hours as needed    Vicodin Hp 10-660 Mg Tabs (Hydrocodone-acetaminophen) .Marland Kitchen... Take 1 tablet by mouth two times a day  Problem # 4:  NICOTINE ADDICTION (ICD-305.1) Assessment: Unchanged  Encouraged smoking cessation and discussed different methods for smoking cessation.   Complete Medication List: 1)  Protonix 40 Mg Tbec (Pantoprazole sodium) .... One tab by mouth once daily 2)  Xanax 1 Mg Tabs (Alprazolam) .... One by mouth two times a day 3)  Zanaflex 4 Mg Tabs (Tizanidine hcl) .... One tab by mouth every 12 hours as needed 4)  Proventil Hfa 108 (90 Base) Mcg/act Aers (Albuterol sulfate) .... Use as needed for shortness of breath 5)  Sudal 12 Tannate 4-30 Mg Chew (Chlorphen mal-pse hcl tannate) .... Chew one tab every 12 hours 6)  Zegerid 40-1100 Mg Caps (Omeprazole-sodium bicarbonate) .... Take 1 tablet by mouth once a day 7)  Vicodin Hp 10-660 Mg Tabs (Hydrocodone-acetaminophen) .... Take 1 tablet by mouth two times a day  Patient Instructions: 1)  Please schedule a follow-up appointment in 4 months. 2)  Tobacco is very bad for your health and your loved ones! You Should stop smoking!. 3)  Stop Smoking Tips: Choose a Quit date. Cut down before the Quit date. decide what you will do as a substitute when you feel the urge to smoke(gum,toothpick,exercise). 4)  med adjustment as discussed Prescriptions: VICODIN HP 10-660 MG TABS (HYDROCODONE-ACETAMINOPHEN) Take 1 tablet by mouth two times a day  #60 x 3   Entered and Authorized by:   Syliva Overman MD   Signed by:   Syliva Overman MD on 01/27/2009   Method used:   Printed then faxed to ...       [facility]* (retail)       [address]       [place], [place]  [address]       Ph: [phone]       Fax: [phone]   RxID:   [id number]

## 2010-02-09 NOTE — Progress Notes (Signed)
Summary: medicines  Phone Note Call from Patient   Summary of Call: Robbie Lis apot never recieved his medicines from yesterday Initial call taken by: Lind Guest,  January 05, 2010 1:04 PM  Follow-up for Phone Call        called to [place] Follow-up by: Adella Hare LPN,  January 05, 2010 1:36 PM
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
## 2010-03-01 ENCOUNTER — Telehealth: Payer: Self-pay | Admitting: Family Medicine

## 2010-03-13 ENCOUNTER — Telehealth: Payer: Self-pay | Admitting: Family Medicine

## 2010-03-13 ENCOUNTER — Ambulatory Visit: Payer: Self-pay | Admitting: Family Medicine

## 2010-03-14 ENCOUNTER — Emergency Department (HOSPITAL_COMMUNITY)
Admission: EM | Admit: 2010-03-14 | Discharge: 2010-03-15 | Disposition: A | Discharge status: Home / Self Care | Payer: Self-pay | Attending: Emergency Medicine | Admitting: Emergency Medicine

## 2010-03-14 ENCOUNTER — Telehealth: Payer: Self-pay | Admitting: Family Medicine

## 2010-03-14 DIAGNOSIS — T438X2A Poisoning by other psychotropic drugs, intentional self-harm, initial encounter: Secondary | ICD-10-CM | POA: Insufficient documentation

## 2010-03-14 DIAGNOSIS — F1010 Alcohol abuse, uncomplicated: Secondary | ICD-10-CM | POA: Insufficient documentation

## 2010-03-14 DIAGNOSIS — F329 Major depressive disorder, single episode, unspecified: Secondary | ICD-10-CM | POA: Insufficient documentation

## 2010-03-14 DIAGNOSIS — F319 Bipolar disorder, unspecified: Secondary | ICD-10-CM | POA: Insufficient documentation

## 2010-03-14 DIAGNOSIS — T424X4A Poisoning by benzodiazepines, undetermined, initial encounter: Secondary | ICD-10-CM | POA: Insufficient documentation

## 2010-03-14 DIAGNOSIS — T43502A Poisoning by unspecified antipsychotics and neuroleptics, intentional self-harm, initial encounter: Secondary | ICD-10-CM | POA: Insufficient documentation

## 2010-03-14 DIAGNOSIS — F3289 Other specified depressive episodes: Secondary | ICD-10-CM | POA: Insufficient documentation

## 2010-03-14 LAB — RAPID URINE DRUG SCREEN, HOSP PERFORMED
Amphetamines: NOT DETECTED
Benzodiazepines: POSITIVE — AB
Cocaine: POSITIVE — AB
Opiates: NOT DETECTED
Tetrahydrocannabinol: POSITIVE — AB

## 2010-03-14 LAB — DIFFERENTIAL
Basophils Absolute: 0 10*3/uL (ref 0.0–0.1)
Lymphocytes Relative: 24 % (ref 12–46)
Lymphs Abs: 2.4 10*3/uL (ref 0.7–4.0)
Neutro Abs: 6.6 10*3/uL (ref 1.7–7.7)
Neutrophils Relative %: 67 % (ref 43–77)

## 2010-03-14 LAB — BASIC METABOLIC PANEL
BUN: 8 mg/dL (ref 6–23)
CO2: 25 mEq/L (ref 19–32)
Calcium: 9.2 mg/dL (ref 8.4–10.5)
Glucose, Bld: 95 mg/dL (ref 70–99)
Sodium: 144 mEq/L (ref 135–145)

## 2010-03-14 LAB — CBC
HCT: 46.2 % (ref 39.0–52.0)
Hemoglobin: 15.7 g/dL (ref 13.0–17.0)
MCV: 89.5 fL (ref 78.0–100.0)
RBC: 5.16 MIL/uL (ref 4.22–5.81)
RDW: 12.5 % (ref 11.5–15.5)
WBC: 9.9 10*3/uL (ref 4.0–10.5)

## 2010-03-15 ENCOUNTER — Ambulatory Visit: Payer: Self-pay | Admitting: Family Medicine

## 2010-03-15 ENCOUNTER — Encounter: Payer: Self-pay | Admitting: Family Medicine

## 2010-03-15 LAB — HEPATIC FUNCTION PANEL
AST: 24 U/L (ref 0–37)
Albumin: 4.2 g/dL (ref 3.5–5.2)
Alkaline Phosphatase: 58 U/L (ref 39–117)
Bilirubin, Direct: 0.1 mg/dL (ref 0.0–0.3)
Total Bilirubin: 0.5 mg/dL (ref 0.3–1.2)

## 2010-03-16 NOTE — Progress Notes (Signed)
Summary: sleeping  Phone Note Call from Patient   Summary of Call: sleeping at night when he is on his back he has stopped breathing. He will wake up gagging trying to get air  . Please call him back at [phone] . Initial call taken by: Lind Guest,  March 01, 2010 11:28 AM  Follow-up for Phone Call        Does he feel as though food and acid coming up if so needs to see GI, pls document Follow-up by: Syliva Overman MD,  March 02, 2010 12:47 PM  Additional Follow-up for Phone Call Additional follow up Details #1::        called patient no answer Additional Follow-up by: Everitt Amber LPN,  March 02, 2010 2:07 PM    Additional Follow-up for Phone Call Additional follow up Details #2::    Patient states depending on what he eats, sometimes he feels burning coming up in his throat and hurting and also he states when he goes to sleep and he's laying down he wakes up gasping for air like he has stopped breathing. Seems like he has been getting shortwinded lately also, has been very stressed out Follow-up by: Everitt Amber LPN,  March 02, 2010 2:11 PM  Additional Follow-up for Phone Call Additional follow up Details #3:: Details for Additional Follow-up Action Taken: advise and fax in omeprazole, we have no samples of reflux meds, pls also I recommend counselling through mental health for stress relief Additional Follow-up by: Syliva Overman MD,  March 02, 2010 6:24 PM  New/Updated Medications: OMEPRAZOLE 20 MG CPDR (OMEPRAZOLE) Take 1 capsule by mouth once a day Prescriptions: OMEPRAZOLE 20 MG CPDR (OMEPRAZOLE) Take 1 capsule by mouth once a day  #30 x 3   Entered by:   Everitt Amber LPN   Authorized by:   Syliva Overman MD   Signed by:   Everitt Amber LPN on 72/53/6644   Method used:   Electronically to        [facility] (retail)       [address]       [place], [place]  [address]       Ph: [phone]       Fax: [phone]   RxID:   [id number] OMEPRAZOLE 20 MG CPDR (OMEPRAZOLE) Take 1 capsule by mouth once a day  #30 x 3   Entered and Authorized by:   Syliva Overman MD   Signed by:   Syliva Overman MD on 03/02/2010   Method used:   Historical   RxID:   [id number]  Patient aware. States mental health is not helping with stress relief and he wants an increase in his xanax's. Advised OV. Luann scheduled patient for Monday

## 2010-03-17 ENCOUNTER — Encounter: Payer: Self-pay | Admitting: Family Medicine

## 2010-03-17 ENCOUNTER — Telehealth: Payer: Self-pay | Admitting: Family Medicine

## 2010-03-21 NOTE — Progress Notes (Signed)
  Phone Note Other Incoming   Caller: dr Avion Patella Summary of Call: pt commited on 03/14/2010 after xanax overdose and stating he wanted to kill himself and 'other people". Stated he is schizo and meds not working. He has repeatedly refused to go tomental health.  He will receive no more xanax from this office, and a note is beiing sent to the pharmacy to this effect today, this note will remain in the pt's record also, the xanax has been removed from his med list. He will need any med like this from mental health as long as he remains a pt here Initial call taken by: Syliva Overman MD,  March 17, 2010 6:08 AM

## 2010-03-21 NOTE — Progress Notes (Signed)
Summary: speak with nurse  Phone Note Call from Patient   Summary of Call: pt needs to speak with nurse about his anixety level. he states anixety is high. [phone].  Initial call taken by: Rudene Anda,  March 13, 2010 4:32 PM  Follow-up for Phone Call        patient missed his appt today Follow-up by: Adella Hare LPN,  March 13, 2010 4:42 PM  Additional Follow-up for Phone Call Additional follow up Details #1::        no med change and nop early work in Additional Follow-up by: Syliva Overman MD,  March 13, 2010 4:57 PM    Additional Follow-up for Phone Call Additional follow up Details #2::    patient is aware he missed appt  states he stops breathing in his sleep  states his anxiety level has increased  patient does have appt wednesday, advised patient to discuss at ov Follow-up by: Adella Hare LPN,  March 13, 2010 5:04 PM

## 2010-03-21 NOTE — Letter (Signed)
Summary: 1st no show  1st no show   Imported By: Lind Guest 03/15/2010 15:58:50  _____________________________________________________________________  External Attachment:    Type:   Image     Comment:   External Document

## 2010-03-21 NOTE — Progress Notes (Signed)
Summary: FYI  Phone Note Call from Patient   Summary of Call: Patient called in and was crying stated he needed to talk to a nurse.  I asked patient if there was something I could help him with and he said he needed to see the doctor.  I advised him that he has an appt in the am.  He asked could he please speak to the nurse, I spoke with Asher Muir who was in the middle of something she told me to advise him if he couldn't wait until the morning to go to the ER.  I got back on phone with patient and advised him to go to ER if he couldn't wait until the morning.  He started crying and asked what were they going to do for him other then charge him out the "ass" and then he stopped crying and asked me to forgive him for being an "ass".  He said he would see Dr. Lodema Hong in the morning. Initial call taken by: Curtis Sites,  March 14, 2010 2:01 PM  Follow-up for Phone Call        i agree, was advised to go to er for eval if unable to make it until appt here in the morning Follow-up by: Adella Hare LPN,  March 14, 2010 2:05 PM  Additional Follow-up for Phone Call Additional follow up Details #1::        noted Additional Follow-up by: Syliva Overman MD,  March 14, 2010 5:09 PM

## 2010-03-21 NOTE — Letter (Signed)
Summary: pharmacy  pharmacy   Imported By: Lind Guest 03/17/2010 13:43:32  _____________________________________________________________________  External Attachment:    Type:   Image     Comment:   External Document

## 2010-03-22 ENCOUNTER — Telehealth: Payer: Self-pay | Admitting: Family Medicine

## 2010-03-24 ENCOUNTER — Encounter: Payer: Self-pay | Admitting: Family Medicine

## 2010-03-24 ENCOUNTER — Ambulatory Visit: Payer: Self-pay | Admitting: Family Medicine

## 2010-03-24 ENCOUNTER — Telehealth: Payer: Self-pay | Admitting: Family Medicine

## 2010-03-27 ENCOUNTER — Ambulatory Visit: Payer: Self-pay | Admitting: Family Medicine

## 2010-03-28 NOTE — Progress Notes (Signed)
Summary: FYI  Phone Note Call from Patient   Summary of Call: pt called office today and said he needed appt and that blood pressure was 240/140 and he was depressed. I gave him friday appt. Ask if he was okay are needed anything. pt stated he felt fine no different than usually. if nurse needs to calll back new number is [phone] Initial call taken by: Rudene Anda,  March 22, 2010 3:00 PM  Follow-up for Phone Call        we should do a nurse bp check today, if he is calling with a bP rept like that Follow-up by: Syliva Overman MD,  March 22, 2010 3:28 PM  Additional Follow-up for Phone Call Additional follow up Details #1::        called patient, left message Additional Follow-up by: Adella Hare LPN,  March 22, 2010 3:37 PM    Additional Follow-up for Phone Call Additional follow up Details #2::    offered nurse visit bp check to patient for this evening and he declined, states he will be here in the morning for his office visit, states he feels fine and he thinks the bp machine that got the high reading was not accurate Follow-up by: Adella Hare LPN,  March 23, 2010 2:07 PM

## 2010-03-28 NOTE — Progress Notes (Signed)
  Phone Note Call from Patient   Caller: Patient Summary of Call: patient calls stating he cannot make it to his appt this morning, states his blood pressure has been fine, patient requests refills on xanax and hydrocodone, advised patient i am unable to refill those meds without an ov, one reason for ov was to discuss medications. patient states  "he will get in here next week" Initial call taken by: Adella Hare LPN,  March 24, 2010 8:25 AM  Follow-up for Phone Call        noted  Follow-up by: Syliva Overman MD,  March 24, 2010 11:45 AM

## 2010-03-30 ENCOUNTER — Other Ambulatory Visit: Payer: Self-pay | Admitting: Family Medicine

## 2010-04-03 ENCOUNTER — Telehealth: Payer: Self-pay | Admitting: Family Medicine

## 2010-04-03 NOTE — Telephone Encounter (Signed)
Faxed letter to pharmacy

## 2010-04-03 NOTE — Telephone Encounter (Signed)
I am NO lONGER prescribing xanax for this pt

## 2010-04-03 NOTE — Telephone Encounter (Signed)
I personally spoke with pharmacist that xanax is discontinued from the office today.  I am also sending a d/c note on the hydrocodone , he has not been in since he was last in the Ed and I do not intend to refill hydrocodone either , pls fax over the written note

## 2010-04-06 ENCOUNTER — Other Ambulatory Visit: Payer: Self-pay

## 2010-04-06 ENCOUNTER — Telehealth: Payer: Self-pay | Admitting: Family Medicine

## 2010-04-06 MED ORDER — OMEPRAZOLE 20 MG PO CPDR: 20.0000 mg | DELAYED_RELEASE_CAPSULE | Freq: Every day | ORAL | Status: DC

## 2010-04-06 NOTE — Telephone Encounter (Signed)
Refilled Omeprazole. Called patient and advised that per your conversation on the 16th, that no more xanax's would be prescribed from this office and he needed mental health to prescribe from now on. He states he was just in a bad place and depressed and now he has a job and is doing a lot better and just needs his xanax's. He said he already tried before to get them from mental health and they would not do so. Please advise what I need to tell him

## 2010-04-06 NOTE — Telephone Encounter (Signed)
msg left for me to durectly spk with the pt

## 2010-04-07 ENCOUNTER — Encounter: Payer: Self-pay | Admitting: Family Medicine

## 2010-04-10 ENCOUNTER — Telehealth: Payer: Self-pay | Admitting: Family Medicine

## 2010-04-10 NOTE — Telephone Encounter (Signed)
Hydrocodone 10/500 one tab po bid 60 with 3 refills sent 01/04/10

## 2010-04-10 NOTE — Telephone Encounter (Signed)
Spoke directly with pt , he understands clearly that I will not be writing xanax for him, he states hje collected pain meds approx 10 days ago, pls call pharmacy and document dosage and quntity, I told him if I restarted the pain med it will be a lower dose and quantity, states he has appt with mental health, he is working and doing better now

## 2010-04-11 ENCOUNTER — Encounter: Payer: Self-pay | Admitting: Family Medicine

## 2010-05-01 ENCOUNTER — Telehealth: Payer: Self-pay | Admitting: Family Medicine

## 2010-05-01 ENCOUNTER — Other Ambulatory Visit: Payer: Self-pay

## 2010-05-01 MED ORDER — HYDROCODONE-ACETAMINOPHEN 10-500 MG PO TABS: ORAL_TABLET | ORAL | Status: DC

## 2010-05-01 NOTE — Telephone Encounter (Signed)
New rx for one daily as needed , maximum 12 tabs every 30 days, is the new direction, pls explain to him and send in 1 month with 1 refill

## 2010-05-01 NOTE — Telephone Encounter (Signed)
Do I refill this for him or no?

## 2010-05-01 NOTE — Telephone Encounter (Signed)
Dr. Lodema Hong already responded in previous note

## 2010-05-05 ENCOUNTER — Encounter: Payer: Self-pay | Admitting: Family Medicine

## 2010-05-09 ENCOUNTER — Encounter: Payer: Self-pay | Admitting: Family Medicine

## 2010-05-11 ENCOUNTER — Encounter: Payer: Self-pay | Admitting: Family Medicine

## 2010-05-16 ENCOUNTER — Telehealth: Payer: Self-pay | Admitting: Family Medicine

## 2010-05-17 NOTE — Telephone Encounter (Signed)
Called patient, left message.

## 2010-05-18 ENCOUNTER — Telehealth: Payer: Self-pay

## 2010-05-18 NOTE — Telephone Encounter (Signed)
Advised Dr didn't prescribe antibiotics without OV and he said that why would he be abusing antibiotics? I told him that we didn't think that, it was just office policy. He is on his way back to [place] and I advised urgent care or ER

## 2010-05-18 NOTE — Telephone Encounter (Signed)
Called patient, left message.
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
## 2010-05-23 NOTE — H&P (Signed)
NAME:  [name] [name]                   ACCOUNT NO.:  [id number]   MEDICAL RECORD NO.:  [id number]          PATIENT TYPE:  AMB   LOCATION:  DAY                           FACILITY:  [facility]   PHYSICIAN:  R. Roetta Sessions, M.D. DATE OF BIRTH:  05/27/1970   DATE OF ADMISSION:  DATE OF DISCHARGE:  LH                              HISTORY & PHYSICAL   CHIEF COMPLAINT:  Continued abdominal pain/chronic nausea.   HPI:  [name] [name] is a 40 year old Caucasian male.  [name] [name] was admitted to  [facility] on August 26, 2006, with abdominal pain, nausea,  vomiting, diarrhea.  [name] [name] also has history of chronic GERD, poorly-  controlled.  [name] [name] had a CT scan of the abdomen and pelvis with contrast,  which showed fatty liver, moderate ascites, thickening in the loops of  the distal ileum, proximal to the terminal ileum and sigmoid colon  thickening.  [name] [name] was again seen in the emergency room at [facility] a couple of days ago.  [name] [name] was diagnosed with an abscess to his  right buttock.  [name] [name] was placed on Septra.  [name] [name] was referred to a surgeon,  but tells me his appointment conflicted with this appointment and,  therefore, [name] [name] had been to see Dr. Franky Macho.  [name] [name] tells me [name] [name] is  taking Tylox t.i.d. for the pain.  [name] [name] was given this by Dr. Olena Leatherwood.  [name] [name]  continues to notice intermittent hematochezia and mucus in his stools.  [name] [name] is having bowel movements once daily now.  [name] [name] continues to have  bilateral lower quadrant cramp-like pain.  [name] [name] was scheduled to have a  colonoscopy under general anesthesia.  Colonoscopy was canceled at that  time due to the fact that [name] [name] is a high-risk surgical candidate with a  positive drug screen by Dr. Jayme Cloud.  [name] [name] presents today for further  workup.   PAST MEDICAL HISTORY:  Hospitalization as described in HPI.  Anxiety  neurosis.  Bipolar disorder.  Chronic low back pain.  Nephrolithiasis.  Right inguinal herniorrhaphy.  Chronic GERD.   CURRENT MEDICATIONS:  1. Combivent inhaler  p.r.n.  2. Oxycodone 5 mg p.r.n.  3. Septra b.i.d.  4. Zantac 300 mg b.i.d.   ALLERGIES:  No known drug allergies.   FAMILY HISTORY:  No known family history of carcinoma, liver, chronic GI  problems.   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  [name] [name] is single and is here  with his girlfriend today.  [name] [name] has three children, ages 2, 6 and 24.  [name] [name]  smokes one or two packs of cigarettes a day.  [name] [name] has a history of  cocaine use in the past, currently uses marijuana.  [name] [name] occasionally  consumes liquor.   REVIEW OF SYSTEMS:  See HPI.   PHYSICAL EXAM:  VITAL SIGNS:  Weight 190 pounds, height 73 inches,  temperature 98 denies, blood pressure 130/90, pulse 68.  GENERAL:  [name] [name] is a well-developed, well-nourished, Caucasian male,  in no acute distress.  [name] [name] is accompanied by his significant other today.  HEENT:  Sclerae  clear, anicteric. Conjunctivae pink. Oropharynx pink and  moist without any lesions.  NECK:  Supple without lymphadenopathy or thyromegaly.  CHEST/HEART:  Regular rate and rhythm, normal S1, S2.  No murmurs, rubs,  thrills or gallops.  LUNGS:  Clear  to auscultation bilaterally.  ABDOMEN:  Positive bowel sounds times four.  No bruits auscultated.  [name] [name]  has mild tenderness to bilateral lower quadrants on deep palpation.  There is no rebound tenderness or guarding, no hepatosplenomegaly or  mass.  Right buttock shows significant erythema and warmth with an area where  incision and drainage was performed.  [name] [name] has dressing in place.  EXTREMITIES:  Without clubbing or edema bilaterally.   IMPRESSION:  [name] [name] is a 40 year old Caucasian male with history of  chronic abdominal pain, nausea, vomiting, and diarrhea (now resolved)  and intermittent hematochezia, CT suspicious for inflammatory bowel  disease or enteritis previously.  [name] [name] is going to require further  evaluation for his continued pain to rule out Crohn's disease or  inflammatory bowel disease.  As far as his right  buttock boil/abscess is  concerned, [name] [name] is scheduled to follow up with Dr. Lovell Sheehan and does need  to do this.  I have urged him to reschedule his appointment.   PLAN:  1. Colonoscopy and EGD with Dr. Jena Gauss in the near future.  I have      discussed both procedures, risks and benefits, including but not      limited to bleeding, infection, perforation, drug reaction.  [name] [name]      agrees to this plan and consent will be obtained.  We will send him      for sedation of choice by anesthesia in the OR.  If [name] [name] is deemed to      be high-risk, [name] [name] will be sent to tertiary care center.  2. [name] [name] is to obtain his pain medication from Dr. Olena Leatherwood, as [name] [name] is      giving him Tylox.  3. Followup with Dr. Lovell Sheehan as soon as possible and continue Septra      as directed.      Lorenza Burton, N.P.      [name], M.D.  Electronically Signed    KJ/MEDQ  D:  11/06/2006  T:  11/06/2006  Job:  [id number]   cc:   Lia Hopping  Fax: [phone]   Dalia Heading, M.D.  Fax: [phone]

## 2010-05-23 NOTE — Group Therapy Note (Signed)
NAMERANEY, KOEPPEN                   ACCOUNT NO.:  [id number]   MEDICAL RECORD NO.:  [id number]          PATIENT TYPE:  INP   LOCATION:  A339                          FACILITY:  [facility]   PHYSICIAN:  Skeet Latch, DO    DATE OF BIRTH:  06-Feb-1970   DATE OF PROCEDURE:  DATE OF DISCHARGE:                                 PROGRESS NOTE   SUBJECTIVE:  Mr. Yeatts is a 40 year old Caucasian male, presented with  complaint of abdominal pain, that has ongoing for several months.  Apparently, the patient states that he had abdominal mass but this is  not proven by any current imaging studies that have been performed.  He  states that his severe abdominal pain started approximately 2 weeks ago  and has been getting progressively worse.  He stated it is located in  his upper right quadrant, but seems to be almost general in nature.  The  patient stated the pain is 10/10 at it's worse, is aggravated by eating  and drinking, has been associated with diarrhea and nausea and vomiting.  Upon being seen in the  emergency room, the patient had a acute  abdominal series that showed:  1. No active lung disease.  The patient did have a CT of abdomen and pelvis which showed:  1. Ascites with areas of thickening in both large and small bowel,      compatible with enteritis.  Inflammatory bowel disease, such as      Crohn's, is within the differential.  2. Showed diffuse fatty infiltration of the liver.  3. Ascites, likely reactive from inflammatory changes, small and large      bowel.  Pelvic CT showed bowel wall thickening, as previously      noted.   OBJECTIVE:  Today:  VITAL SIGNS:  Temperature is 98.1, pulse 74, respirations 20, blood  pressure 127/74.  The patient satting 93% on room air.  CARDIOVASCULAR:  Heart is regular rate and rhythm.  No murmurs, gallops or rubs.  LUNGS:  Bilateral wheezing, coarse breath sounds.  No rales or rhonchi.  ABDOMEN:  Soft.  Positive tenderness to the right upper and  lower  quadrant. with some radiation to his periumbilical region.  Positive  bowel sounds are appreciated.  No guarding or rigidity.  EXTREMITIES:  No clubbing, cyanosis or edema.   LABS:  Amylase is 31.  His amylase and lipase previously were within  normal limits.   ASSESSMENT/PLAN:  1. Acute abdominal pain.  The patient will be kept with clear liquids      and diet as tolerated at this time.  The patient will need a      gastrointestinal consultation, depending on how the patient does in      the next 1 to 2 days.  This may be as an inpatient or as an      outpatient, because the patient will need a colonoscopy.  At this      time, secondary to the patient's CT results, place the patient on      Flagyl IV q.8 h.  The patient may need Cipro to be added to this      regimen.  Will see how the patient does, just with Flagyl at this      time. 2.  Intractable nausea and vomiting.  The patient continues      to have Phenergan and Zofran, seems to be helping his nausea and      vomiting at this time.  Continue this for his nausea.  2. Polysubstance abuse.  The patient will need counseling regarding      this.  The patient has a history of EtOH abuse and at this time      does not seem to be going through any withdrawals.  If patient      develops signs of      withdrawals, will need to be transferred telemetry unit.  3. Leukocytosis.  Will get a repeat CBC.  The patient will have      antibiotics added to his regimen.  4. Tobacco abuse.  The patient will be placed on nicotine patch.      Skeet Latch, DO  Electronically Signed     SM/MEDQ  D:  08/24/2006  T:  08/25/2006  Job:  [id number]

## 2010-05-23 NOTE — Consult Note (Signed)
NAMESHADOW, SCHEDLER                   ACCOUNT NO.:  [id number]   MEDICAL RECORD NO.:  [id number]          PATIENT TYPE:  INP   LOCATION:  A339                          FACILITY:  [facility]   PHYSICIAN:  R. Roetta Sessions, M.D. DATE OF BIRTH:  20-Jan-1970   DATE OF CONSULTATION:  08/26/2006  DATE OF DISCHARGE:                                 CONSULTATION   REQUESTING PHYSICIAN:  Dr. Elige Radon.   REASON FOR CONSULTATION:  Abdominal pain, nausea and vomiting.   HPI:  Mr. Trickett is a 40 year old Caucasian male who developed severe  generalized abdominal cramping which doubled him over about 4 evenings  ago.  He also had nausea and vomiting with loose stools.  He complains  of a several month history of early morning nausea and vomiting with  yellow bile emesis.  He has a history of chronic GERD, has frequent  water brash, heartburn and indigestion at least 4 times a week.  He has  frequent regurgitation.  He complains of dysphagia with mostly solid  foods, he feels like they get stuck in his upper esophagus.  He also  complains of odynophagia.  He has chronic cough and throat clearing.  He  complains of frequent loose stools.  He has had nonbloody diarrhea for  several days now, about 6 stools per day.  He tells me at home he can  also go 2-3 days without a bowel movement.  He has symptoms which  alternate between constipation and diarrheal stools.  He does have  significant urgency.  He denies any ill contacts, denies any foreign  travel, he has been on recent antibiotics.  He was on a Z-Pak for  bronchitis.  He denies any other new medications, denies any NSAID use.  He tells me he has had diarrhea for about a week now.  He did not have a  stool, however, yesterday.  He denies any fevers, did have some chills  with these symptoms.   He recently had a CT of the abdomen and pelvis with contrast which  showed fatty liver, moderate ascites, thickening in the loops of the  distal ileum proximal to the  terminal ileum, and sigmoid colon  thickening.  On August 24, 2006, he had an abdominal ultrasound which  shows fatty liver and a CBD of 7.5 mm.   PAST MEDICAL SURGICAL HISTORY:  1. Anxiety.  2. Chronic low back pain.  3. Bipolar disorder.  4. Renal lithiasis.  5. Inguinal hernia repair.  6. Chronic GERD.  7. Dyslexia.   MEDICATIONS PRIOR TO ADMISSION:  1. Clonazepam 1 mg daily.  2. Xanax 0.5 mg p.r.n.  3. Oxycodone/APAP p.r.n.   ALLERGIES:  No known drug allergies.   FAMILY HISTORY:  1. Mother is alive and healthy at age 33.  2. Father deceased at age 46 with lung cancer.  3. No known family history of colorectal carcinoma or other chronic GI      problems.   SOCIAL HISTORY:  1. 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  2. He is single.  3. He has 3 children ages  2, 8 and 16.  4. He has a 22-pack year history of tobacco use.  5. He has a history of cocaine use, tells me he has not used in a      year.  6. He frequently smokes marijuana, the last time was about a week ago.  7. He has a 6-pack year history of tobacco use.  8. Occasionally consumes liquor.   REVIEW OF SYSTEMS:  See HPI.  EXTREMITIES:  He does have complaints of  trace lower extremity edema.  PULMONARY:  He has chronic nonproductive  cough.   PHYSICAL EXAM:  VITAL SIGNS:  Weight is 95.3 kg, height 73 inches, temp  98.6, blood pressure 128/82, pulse 67, respirations 18.  GENERAL:  Mr. Bailly is a well-developed, well-nourished Caucasian male in  no acute distress.  HEENT:  __________ , nonicteric.  Conjunctiva pink.  Oropharynx pink and  moist without any lesions.  NECK:  Supple with no masses or thyromegaly.  CHEST:  Heart regular rate and rhythm, normal S1 S2, without any  murmurs, clicks, rubs or gallops.  LUNGS:  With inspiratory and expiratory wheezes bilaterally, no acute  distress.  ABDOMEN:  Positive bowel sounds x4, no bruits auscultated, soft,  nondistended.  He does have generalized abdominal pain on  palpation.  There is no rebound, tenderness or guarding, no hepatosplenomegaly or  mass.  EXTREMITIES:  With trace lower extremity edema to bilateral ankles.   LABORATORY STUDIES:  From August 17, he had a hemoglobin of 13.8 and  hematocrit 40.6, WBCs 10.1, platelets 242.  Sodium 141, potassium 3.5,  chloride 104, CO2 31, BUN 5, creatinine 0.73, glucose 73 and calcium  8.8, total bilirubin 0.5, alkaline phosphatase 43, AST 25, ALT 27, total  protein 6.1, albumin 3.4 and amylase 31, alcohol level less than 5.  Urine drug screen positive for benzodiazepines, opiates and  tetrahydrocannabinoid.   IMPRESSION:  1. Mr. Griffing is a 40 year old Caucasian male with acute onset abdominal      pain, nausea, vomiting and diarrhea.  CT is suggestive of      enteritis/colitis, suspect infectious versus inflammatory bowel      disease.  The patient has responded to antibiotic therapy without      much further diarrhea.  Nausea and vomiting is resolved.  There is      a questionable underlying history of irritable bowel syndrome.  2. He has chronic gastroesophageal reflux disease with solid food      dysphagia.  Will need further evaluation to rule out web, ring or      stricture but no need for acute procedure at this time.  3. Common bile duct is upper limits of normal at 7.5 millimeters      without evidence of stones or transaminitis.  He does have a fatty      liver.  4. He has history of polysubstance abuse.   PLAN:  1. Full set of stool studies to include C. diff, ova & parasite,      culture & sensitivity and lactoferrin.  2. Daily PPI.  3. Complete at least 5-day course of antibiotics.  4. EGD with possible ED in the near future.  5. If his diarrhea persists, he will need colonoscopy as well.  6. Advance diet as tolerated.  7. Supportive measures including pain control and IV fluids.   We would like to thank Dr. Elige Radon for allowing [place] to participate in the  care of Mr. Nestle.  Lorenza Burton, N.P.      [name], M.D.  Electronically Signed    KJ/MEDQ  D:  08/26/2006  T:  08/26/2006  Job:  [id number]

## 2010-05-23 NOTE — Group Therapy Note (Signed)
[name], [name]                   ACCOUNT NO.:  [id number]   MEDICAL RECORD NO.:  [id number]          PATIENT TYPE:  INP   LOCATION:  A305                          FACILITY:  [facility]   PHYSICIAN:  Dorris Singh, DO    DATE OF BIRTH:  08/25/1970   DATE OF PROCEDURE:  DATE OF DISCHARGE:                                 PROGRESS NOTE   The patient seen today doubled over complaining of pain.  States that he  has gotten no relief. Talked to him regarding his current status with  Dr. Luvenia Starch office, Dr. Cira Servant has agreed to see him for today but also  possibly may run a capsular study on him.  There is some concern which  he has not followed up with [facility] regarding Crohn's disease which he  has been asked to do on several occasions but has been noncompliant.  Talked to the patient, he is concerned about his pain medication that he  is not getting enough. Wanted to take more medication including Tylox  and Percocet. Explained to him that would not happen.  Also the patient  was going into other patient's rooms trying to find the nurse telling  them that it was time for his pain medication.  I spoke with the  patient, explained to him that he would not do.  That was one, against  HIP[employer] rules, and two, that if he is on pain medications he needed to be  confined to his room to decrease his risk of fall.   PHYSICAL EXAMINATION:  VITALS:  His vitals are as follows temperature  97.4, pulse 66, respirations 16, blood pressure 115/56.  GENERAL:  This is a 40 year old Caucasian male who is well-developed,  well-nourished in no acute distress.  HEART:  Regular rate and rhythm.  LUNGS:  Clear to auscultation bilaterally.  EXTREMITIES:  Positive pulses.  No edema or cyanosis noted.   LABORATORY DATA:  His labs for today include white count of 10.2,  hemoglobin of 13.3, hematocrit 37.9, platelet count of 218 and his  chemistry was  within normal limits except for glucose at 100.  He  tested positive  for benzodiazepines, cocaine and cannabis and his fecal  occult blood was positive.   ASSESSMENT AND PLAN:  1. Abdominal pain.  2. Medical noncompliance.  3. Chronic pain.  4. Multi substance abuse.   PLAN:  GI has been consulted and they are following his case.  They may  plan to do a capsular study, await any further recommendations.  He is  scheduled to see the gastroenterologist at [facility] on the  25th as  well.  Talked to the patient about him not being compliant and that he  needs to follow-up because this disease can also get worse and make him  severely ill.  Also it has been discussed with him to stop any illicit  drug use as well and that this can contribute to some of his problem  too.  Will continue to follow him, anticipate discharge in 1 day.      Dorris Singh,  DO  Electronically Signed     CB/MEDQ  D:  03/12/2007  T:  03/13/2007  Job:  [id number]

## 2010-05-23 NOTE — Discharge Summary (Signed)
[name], SHUTES                   ACCOUNT NO.:  [id number]   MEDICAL RECORD NO.:  [id number]          PATIENT TYPE:  INP   LOCATION:  A305                          FACILITY:  [facility]   PHYSICIAN:  Dorris Singh, DO    DATE OF BIRTH:  [birth year]   DATE OF ADMISSION:  03/11/2007  DATE OF DISCHARGE:  03/05/2009LH                               DISCHARGE SUMMARY   ADMISSION DIAGNOSES:  1. Abdominal pain.  2. History of Crohn's disease.  3. History of polysubstance abuse.   DISCHARGE DIAGNOSES:  1. Abdominal pain.  2. Medical noncompliance.  3. Multisubstance abuse.  4. Chronic pain.   PRIMARY CARE PHYSICIAN:  Lia Hopping,  MD, [place], [place].   GI SPECIALIST:  Has been R. Roetta Sessions, M.D.; however, recently the  patient was discharged from this practice with the formal letter about a  week ago which he claims at that point in time he did not receive it.   His H&P was done by Dr. Rito Ehrlich. Please refer to that. The patient was  admitted for the above diagnoses to the service of [facility]. It was  concluded that he was supposed to have a colonoscopy at [facility] on April 02, 2007.  He has a suspected Crohn's disease which had  never been proven. The patient was admitted for extreme pain control.  Also, he had a leukocytosis.  He had an ultrasound done that revealed  multiple loops of bowel which had mild bile wall thickening, so he was  started on Cipro and Flagyl for 7 days, and it was determined he should  be n.p.o.  While the patient was here, he continued to complain of  abdominal pain, but we were able to control that with IV narcotics. It  was determined that he needed to follow up with [facility] and keep his  appointment as recommended.  We advanced his diet.  He was able to eat.  At that point in time, it was determined he could be discharged home.  I  personally spoke with Dr. Olena Leatherwood and explained to him the patient's  course while he was here and  that we would not send him home on any pain  medications, that if he needed them, he needed to speak to Dr. Olena Leatherwood  personally, and the patient was sent home on the following medications  that he could take.  His home medications included  1. Xanax 1 mg.  2. Tylox 5/500 three times a day.  3. Septra twice.  4. DS twice a day.  5. Neurontin 300 mg three times a day.  6. Zanaflex 4 mg twice a day.   The patient's condition was stable, and his disposition was to home.      Dorris Singh, DO  Electronically Signed     CB/MEDQ  D:  04/13/2007  T:  04/13/2007  Job:  [id number]

## 2010-05-23 NOTE — H&P (Signed)
[name], [name]                   ACCOUNT NO.:  [id number]   MEDICAL RECORD NO.:  [id number]          PATIENT TYPE:  INP   LOCATION:  A339                          FACILITY:  [facility]   PHYSICIAN:  Dorris Singh, DO    DATE OF BIRTH:  1970/12/17   DATE OF ADMISSION:  08/23/2006  DATE OF DISCHARGE:  LH                              HISTORY & PHYSICAL   CHIEF COMPLAINT:  Abdominal pain, nausea and vomiting.   HISTORY OF PRESENT ILLNESS:  Patient is a 40 year old male, who  presented with the complaint of abdominal pain, stated that he had been  told, several months ago, that he had abdominal mass, but this has not  been proven by any current imaging and which he has had several images  over the last few years and has not proven this.  He stated that the  abdominal pain started two weeks ago.  It just was insidious.  It kept  getting worse.  The patient does not have a physician here in town and  did not go to his primary care physician at that point in time.  He  stated that it is located upper quadrant, on my examination it was  located upper quadrant, but in the ER it was located lower quadrant and  periumbilical region.  Patient was stating that his pain was 10/10 and  it was aggravated by eating and drinking water and symptoms have been  associated with diarrhea, nausea and vomiting and he has no other  significant history.   PAST MEDICAL HISTORY:  Significant for anxiety, back pain, bipolar  disorder and kidney stone.   FAMILY HISTORY:  Hypertension.   SURGICAL HISTORY:  Includes hernia repair.   SOCIAL HISTORY:  He smokes cigarettes, about a pack per day.  He is an  occasional drinker.  He also occasionally uses marijuana, as well as  cocaine.   ALLERGIES:  There are no known drug allergies.   MEDICATIONS:  Patient is on several medications.  He did not know the  dose of any.  However, he currently takes ibuprofen, Klonopin, Xanax,  Tylox and hydrocodone.   REVIEW OF  SYSTEMS:  GENERALLY:  Patient denies any weight changes or  fatigue.  SKIN:  No changes of hair, skin or nail changes.  HEAD:  No  changes in frequency or trauma.  EYES:  No changes in vision.  EARS:  No  changes in hearing.  NOSE:  No rhinorrhea or stuffiness.  MOUTH:  No  bleeding gums, hoarseness or sore throat.  CARDIAC:  No hypertension,  murmurs or angina.  RESPIRATORY:  No shortness of breath.  GI:  Positive  nausea, vomiting, diarrhea and abdominal pain.  GU:  No dysuria,  hesitancy or hematuria.  MUSCULOSKELETAL:  No muscular weakness,  instability or redness of any joints.  NEUROLOGIC:  No loss of  sensation, numbness or tingling.  PSYCHIATRIC:  Positive history of  bipolar disorder.   PHYSICAL EXAM:  CURRENT VITALS:  Temperature 97.4, pulse 85,  respirations 20, blood pressure 140/94.  GENERAL:  Patient is a  40 year old Caucasian male, who is well-  developed, well-nourished.  He is a little obtunded with answering  questions.  His affect is flat.  SKIN:  No skin scars.  Positive tattoos.  No bruising.  Hair consistency  within normal limits.  EYES:  Pupil size equal, reactive to light bilaterally.  No conjunctival  injection or scleral icterus.  EARS:  Normal shape, nontender, no external discharge.  TMI is  visualized bilaterally.  NOSE:  No discharge noted.  MOUTH AND THROAT:  Teeth are in poor repair.  No erythema or exudate  noted.  NECK:  No masses, full range of motion, no tracheal deviation.  LUNGS:  Clear to auscultation bilaterally, no wheezes, rales or rhonchi.  ABDOMEN:  Round, positive bowel sounds in all four quadrants.  Generalized diffuse rebound tenderness, no masses noted.  Positive  guarding.  Positive Murphy sign and no CVA tenderness.  GU:  External male genitalia within normal limits.  MUSCULOSKELETAL:  No muscle atrophy or weakness.  Full range of motion  of joints.  No redness or swelling.  NEUROLOGIC:  Cranial nerves II through XII grossly  intact.   TESTS AND LABS:  Patient had a CT of the abdomen with contrast.  Small  bowel distention and segmental large bowel.  Suspect Crohn's disease.   A urine that was done was high with the specific gravity.  Everything  else was within normal limits.  His lipase was 10.  He had a CMP done  with sodium of 136, potassium 4, chloride 103, carbon dioxide 26,  glucose 156, BUN 22, creatinine 1.13.  He also had a CBC done with a  white count of 13.1 and a hemoglobin of 17.7, hematocrit 51.9, platelet  count 304, but his differential percent neutrophils is 76, which is  norma range.   ASSESSMENT AND PLAN:  1. Acute abdominal pain.  Was admitted for general, but examination of      his upper right quadrant.  2. Intractable nausea and vomiting.  3. Drug abuse.  4. Leukocytosis with normal differential.  5. Tobacco abuse.   PLAN:  We will admit to 23-hour observation.  We will keep patient  n.p.o.  We will hydrate via IV fluids.  We will give him antiemetics and  pain management.  We will order abdominal and pelvic ultrasound.  We  will continue with GI prophylaxis.  If ultrasound is negative, can  anticipate discharge within one to two days.  Also, will schedule  patient to see GI for the suspicion of Crohn's disease; however, did not  make it a diagnosis because of the suspicion.  We will repeat amylase  and CBC this morning to see if there are any changes.  If patient  continues to have leukocytosis, then we will consider possible  antibiotic therapy.      Dorris Singh, DO  Electronically Signed     CB/MEDQ  D:  08/24/2006  T:  08/24/2006  Job:  [id number]

## 2010-05-23 NOTE — Consult Note (Signed)
NAMEKRZYSZTOF, REICHELT                   ACCOUNT NO.:  [id number]   MEDICAL RECORD NO.:  [id number]          PATIENT TYPE:  INP   LOCATION:  A305                          FACILITY:  [facility]   PHYSICIAN:  Kassie Mends, M.D.      DATE OF BIRTH:  1970/06/02   DATE OF CONSULTATION:  03/12/2007  DATE OF DISCHARGE:                                 CONSULTATION   REFERRING PHYSICIAN:  Dr. Dorris Singh.   REASON FOR CONSULTATION:  Abdominal pain.   HISTORY OF PRESENT ILLNESS:  Mr. Arps is a 40 year old male who has  chronic abdominal pain.  He reports that for a very long time he had  intermittent abdominal pain, vomiting and diarrhea.  He started taking  Zegerid approximately a  month ago and was a whole lot better.  Yesterday morning, he developed worsening of his abdominal pain and  vomiting.  He vomited 7 to 8 times but did not see any blood in his  vomit.  He also said he saw just a tinge of blood on the tissue twice.  He was at home for 4 hours before he came to the emergency department.  He describes the pain as being located in the periumbilical region, in  his epigastrium and his left upper quadrant.  It is sharp and feels like  a hot knife.  With the Zegerid he was able to keep down whatever he  wanted to prior to yesterday.  He says he has early morning nausea.  His  nausea is better now.  He says he lost from 215 down to 170 but is now  back up to 180 pounds with the Zegerid.  He has been only taking Tylox  for pain.  He has had no travel, antibiotics or consumption of fresh  water.  He denies any use of ibuprofen, Motrin, Aleve, BC's or Goody  powders.  He has chronic abdominal pain, and he says his pain is usually  2 to 3 out of 10, but yesterday it became 7 to 8 out of 10.  He says  that Zegerid relieves his heartburn 90% of the time.  He is not having  any problems swallowing.   PAST MEDICAL HISTORY:  1. Abdominal pain.  2. Low back pain.   PAST SURGICAL HISTORY:  Hernia  repair.   ALLERGIES:  NO KNOWN DRUG ALLERGIES.   MEDICATIONS:  1. Xanax.  2. Cipro 200 mg IV q. 12 h.  3. Lovenox 40 mg q. 24 h.  4. Flagyl 250 mg IV q.8 h.  5. Protonix 40 mg q.24 h.  6. Nicoderm.  7. Dilaudid as needed.  8. Zofran as needed.   FAMILY HISTORY:  His grandmother has history of spastic colon.  He  denies any family history of colon cancer or colon polyps.   SOCIAL HISTORY:  He may drink 1 to 2 beers a week.  He smokes.  He lives  with his mother.   REVIEW OF SYSTEMS:  As per HPI, otherwise, all systems are negative.   PHYSICAL EXAMINATION:  VITAL SIGNS:  T-max  98.4, systolic blood pressure  115 to 112, O2 sat 95% on room air.  GENERAL:  He is in no apparent distress, alert and oriented x4.  He  makes good eye contact.  HEENT:  Exam is atraumatic, normocephalic.  Pupils equal and reactive to light.  Mouth:  No oral lesions.  NECK:  Has full range of motion.  No lymphadenopathy.  LUNGS:  Clear to  auscultation bilaterally.  CARDIOVASCULAR:  Regular rhythm, no murmur.  ABDOMEN:  Bowel sounds are present.  He has 7/10 epigastric tenderness  and 8/10 left upper quadrant tenderness to palpation.  He has  voluntary guarding and no rebound.  EXTREMITIES:  Have no cyanosis or  edema.  NEUROLOGIC:  He has no focal neurologic deficits.   LABORATORIES:  His white count yesterday was 27,000 and is down to 10.2.  His hemoglobin is 13.3, platelets 218, creatinine 0.83.  His hepatic  function panel is normal.  Lipase 21.  Urine drug screen positive for  benzodiazepines, opiates and marijuana.  His UA is negative.   RADIOGRAPHIC STUDIES:  A CT scan of the abdomen and pelvis with contrast  performed on March 3 revealed multiple loops of bowel likely in the  jejunum, which had a mild bowel wall thickening.  I reviewed the CT scan  personally with Dr. Britta Mccreedy.   ASSESSMENT:  Mr. Seto is a 40 year old male with chronic abdominal pain  whose symptoms had improved on  Zegerid.  He presents with acute increase  with abdominal pain, nausea, vomiting and diarrhea.  His CT scan shows  small bowel enteritis, and his colon is unremarkable.  The differential  diagnosis includes acute viral gastroenteritis and a low likelihood of  celiac sprue or bacterial enteritis or IBD.   Thank you for allowing me to see Mr. Klinger in consultation.  My  recommendations follow.   RECOMMENDATIONS:  1. Continue Cipro and Flagyl for 7 days.  2. He should be n.p.o. after midnight, and we will reassess him in the      morning.  Would consider the benefits versus the risks of capsule      endoscopy.  3. Check quantitative immunoglobulin and tTG-IgA.  4. Continue pain medicine and antiemetics.  5. Advance to a lactose-free full-liquid diet.  6. Add Flora-Q daily.   LABS:  IgA & TTG IgA nl.      Kassie Mends, M.D.  Electronically Signed     SM/MEDQ  D:  03/12/2007  T:  03/12/2007  Job:  [id number]

## 2010-05-23 NOTE — Assessment & Plan Note (Signed)
NAMEEUTIMIO, GHARIBIAN                    CHART#:  [id number]   DATE:                                   DOB:  July 10, 1970   REASON FOR VISIT:  Abdominal pain, nausea, vomiting and abnormal CT  previously.   HISTORY OF PRESENT ILLNESS:  Mr. Buzby was seen once again by [place] on  11/06/2006.  He has chronic abdominal pain, nausea, vomiting and  diarrhea.  He has intermittent hematochezia.  CT during recent [facility] hospitalization demonstrated possible inflammatory bowel disease  with enteritis.  He has boils/abscesses on both buttocks.  He tells me  he went to [facility] ED and he was found to have MRSA.  He has been  treated with Cipro multiple courses by Dr. Bradly Bienenstock and things are not  getting any better.  He was scheduled to see Dr. Lovell Sheehan, but Dr.  Lovell Sheehan required cash up front.  He was unable to pay and has  consequently not seen a Careers adviser.  We attempted to get him in for both  EGD and colonoscopy in the OR previously, but his drug screen was  positive and Anesthesia cancelled the procedure.  We attempted to  reschedule him; however, he failed to show up as planned.  He has been  taking oxycodone under the directions of Dr. Bradly Bienenstock.   CURRENT MEDICATIONS:  See updated list.  Apparently, he just finished a  course of both Cipro and Flagyl.   PHYSICAL EXAMINATION:  GENERAL APPEARANCE:  He appears to be in no acute  distress.  Weight is 179.  Height is 6 feet and 1 inches.  VITAL SIGNS:  Temperature 99.4.  BP 122/82.  Pulse 84.  SKIN:  Warm and dry.  CHEST:  Lungs are clear to auscultation.  CARDIOVASCULAR:  Regular rate and rhythm without murmur, gallop or rub.  ABDOMEN:  Distended.  Positive bowel sounds.  There is minimal right-  sided abdominal tenderness.  BUTTOCKS:  He has multiple excoriated areas and he has a ping pong sized  lump over the left buttock and one on the right, suspicious for  abscesses.   ASSESSMENT:  Mr. Heupel has a number loose ends as far as the  gastrointestinal tract is concerned, diarrhea, abdominal pain,  intermittent nausea and vomiting.  Gastrointestinal evaluation via upper  and lower endoscopy has been derailed secondary to polysubstance abuse  and his noncompliance by his bipolar illness thwarting out efforts as  well I feel.   His methicillin-resistant Staphylococcus aureus buttock/gluteal  abscesses and has not seen a surgeon as of yet.  I told Mr. Neidig it is  unlikely that I will be able to help him with a thorough  gastrointestinal evaluation given his polysubstance abuse and feel it  would be in his best interest to go ahead and get an appointment to  first see one of the surgeons in the gastrointestinal clinic over at  [facility] and then he can pursue a gastrointestinal evaluation  subsequent to that encounter.  I do not think that we will be able to do  him here under conscious sedation and it would be best for him to obtain  his specialty care at [facility] referral center.  We will go  ahead and make the appropriate appointments.  No need for scheduled follow up here.  We will communicate my findings  and recommendations with Dr. Bradly Bienenstock.       [name], M.D.  Electronically Signed     RMR/MEDQ  D:  12/18/2006  T:  12/19/2006  Job:  [id number]   cc:   Dr. Bradly Bienenstock

## 2010-05-23 NOTE — Group Therapy Note (Signed)
[name], [name]                   ACCOUNT NO.:  [id number]   MEDICAL RECORD NO.:  [id number]          PATIENT TYPE:  INP   LOCATION:  A305                          FACILITY:  [facility]   PHYSICIAN:  [name] Singh, DO    DATE OF BIRTH:  1970-11-13   DATE OF PROCEDURE:  03/13/2007  DATE OF DISCHARGE:                                 PROGRESS NOTE   The patient seen today with friends in the room, laughing and giggling.  When I saw the patient, stated that he was in a lot of pain.  Talked to  him regarding his possible discharge planning if not tonight, then first  thing tomorrow morning.  I told him I would talk to Dr. Olena [name]  regarding his pain management, which I have.  Dr. Olena [name] said for  patient to call him regarding any pain meds that he may need upon  discharge.   His vitals are as follows.  Temperature 97.9, pulse 70, respirations 20,  blood pressure 114/75.  GENERAL:  The patient is a 40 year old male who is well-developed, well-  nourished, but not in acute distress upon my entering the room, but then  became distressed during interview.  HEART:  Regular rate and rhythm.  LUNGS:  Clear to auscultation bilaterally.  ABDOMEN:  Diffuse tenderness.  EXTREMITIES:  Positive pulses.  No edema, cyanosis or ecchymosis noted.   LABORATORY DATA:  White count is normal, hemoglobin 11.9, hematocrit  34.8 and platelet count of 193,000.  His chemistries within normal  limits.   ASSESSMENT AND PLAN:  1. Abdominal pain.  2. Medical noncompliance.  3. Chronic pain.  4. Multisubstance abuse.  5. Possible history of Crohn's disease.   PLAN:  Based on what GI states they can do for him, he does have an  appointment at [facility] in about 10 days.  We will go and recommend that  he follows up on that based on the recommendations from GI.  Plan on  possible discharge this evening if not first thing tomorrow due to the  patient's medical noncompliance and multi substance abuse, as well that  we hope he will continue with the followup as recommended.  Talked to  Dr. Olena [name], he is to follow up with him for pain medications.  The  patient has been told this and stated understanding.      [name] Singh, DO  Electronically Signed    CB/MEDQ  D:  03/13/2007  T:  03/13/2007  Job:  [id number]

## 2010-05-23 NOTE — H&P (Signed)
NAME:  [name], [name]                   ACCOUNT NO.:  [id number]   MEDICAL RECORD NO.:  [id number]          PATIENT TYPE:  AMB   LOCATION:  DAY                           FACILITY:  [facility]   PHYSICIAN:  R. Roetta Sessions, M.D. DATE OF BIRTH:  1970-06-07   DATE OF ADMISSION:  DATE OF DISCHARGE:  LH                              HISTORY & PHYSICAL   CHIEF COMPLAINT:  Longstanding reflux symptoms, intermittent esophageal  dysphagia, history of diarrhea with some blood per rectum.   [name] is a 40 year old Caucasian male with polysubstance abuse,  bipolar disorder, admitted to [facility] recently with  abdominal pain, nausea and vomiting, diarrhea.  He was found to have a  picture consistent with enterocolitis on CT scan.  He slowly improved on  Cipro and Flagyl.  He has improved but has had some intermittent blood  per rectum.  Diarrhea has settled down.  He continues to have diffuse  abdominal cramping and he has been taking oxycodone for that and he just  ran out.  He has come back to see me, making plans to perform an EGD and  colonoscopy if needed.  There is no family history of GI neoplasia.  He  has never had his upper GI tract evaluated.  He is not on any acid-  suppression therapy.  He has had reflux symptoms for many years and now  has intermittent esophageal dysphagia.   PAST MEDICAL HISTORY:  1. Anxiety neurosis.  2. Chronic low back pain.  3. Bipolar disorder.  4. Nephrolithiasis.  5. Status post right inguinal hernia repair.  6. Chronic GERD.   CURRENT MEDICATIONS:  Just finished Cipro and Flagyl.  Combivent  inhaler.  He has been recently taking clonazepam and Xanax but is  currently off those medications.  He is scheduled to see Dr. Betti Cruz, his  psychiatrist, in the near future.   FAMILY HISTORY:  Negative for chronic GI or liver illness.   SOCIAL HISTORY:  The patient is a disabled Nutritional therapist.  He is single.  He  has three children, ages 45, 93 and 78.  He smokes 1-2  packs of cigarettes  per day.  He used cocaine and marijuana frequently in the recent past.  He occasionally consumes hard liquor.   REVIEW OF SYSTEMS:  Has not had any fever or chills.  No change in  weight recently.  Otherwise as in the history of present illness.   PHYSICAL EXAMINATION:  A pleasant 41 year old gentleman accompanied by  his girlfriend.  Weight 209.  Height is 6 feet.  Temperature 98.3, BP 130/86, pulse 80.  SKIN:  Warm and dry.  There is no jaundice.  HEENT:  No scleral icterus.  Conjunctivae are pink.  CHEST:  Lungs are clear to auscultation.  CARDIAC:  Regular rate and rhythm without murmur, gallop, or rub.  ABDOMEN:  Flat, positive bowel sounds.  He does have some epigastric  tenderness to palpation.  No appreciable mass or organomegaly.  EXTREMITIES:  No edema.  RECTAL:  Will be deferred to the time of colonoscopy.  IMPRESSION:  [name] is a pleasant 40 year old gentleman with a  history of bipolar disorder and polysubstance use and abuse, with  longstanding gastroesophageal reflux disease symptoms along with  esophageal dysphagia.  He has had a recent bout of what appears to be an  enterocolitis.  Symptoms have improved but not totally resolved.  He  does report some intermittent blood per rectum.   RECOMMENDATIONS:  [name] needs both an EGD with possible esophageal  dilation and colonoscopy in the near future at [facility].  The  potential risks, benefits, and alternatives have been reviewed with him  and his girlfriends.  His questions were answered.  He is agreeable.  These procedures will need to be done in conjunction with anesthesia as  he is likely going to be highly tolerant of the usual conscious sedation  medications we use.  Will alert anesthesia.  Will also start him on some  reflux medication in the way of Zegerid 40 mg orally daily, a 2-week  supply given.  I have also agreed to give him a prescription for  Percocet 5/500 mg  one q.6h. p.r.n. pain, #30 given.  Will make further  recommendations in the very near future.  Copy to Dr. Erle Crocker,  who will be the patient's new primary care physician.      [name], M.D.  Electronically Signed     RMR/MEDQ  D:  09/10/2006  T:  09/10/2006  Job:  [id number]   cc:   Erle Crocker, M.D.

## 2010-05-23 NOTE — Letter (Signed)
February 25, 2007    Sheran Fava. Mcelvain   Dear Mr. Record:   I am writing this letter to let you know of our intent to terminate our  physician-patient relationship.  We will no longer be responsible for  your medical care after April 01, 2007.   As previously discussed, due to the complex nature of your medical  problems, your care should continue at a major medical center like  [facility].  We encourage you to keep your appointment with [facility] and follow their recommendations for care.   Upon proper authorization, we will be glad to provide a copy of your  medical records to the physician of your choice.  You will find a copy  of an Authorization enclosed with this letter.   It is important that you follow up with another gastroenterologist for  your medical problems.  Please do not neglect your health.     [name], M.D.  Electronically Signed     RMR/MEDQ  D:  02/25/2007  T:  02/25/2007  Job:  [id number]

## 2010-05-23 NOTE — H&P (Signed)
[name], [name]                   ACCOUNT NO.:  [id number]   MEDICAL RECORD NO.:  [id number]          PATIENT TYPE:  INP   LOCATION:  A305                          FACILITY:  [facility]   PHYSICIAN:  Osvaldo Shipper, MD     DATE OF BIRTH:  [birth year]   DATE OF ADMISSION:  03/11/2007  DATE OF DISCHARGE:  LH                              HISTORY & PHYSICAL   PRIMARY MEDICAL DOCTOR:  Lia Ho[place].   The patient has been followed by Dr. Jena Gauss up until recently, and  actually the patient was discharged from their practice just about a  week ago.   ADMITTING DIAGNOSES:  1. Abdominal pain,  2. Possible history of Crohn's disease and chronic abdominal pain.  3. History of polysubstance abuse.   CHIEF COMPLAINT:  Abdominal pain since 4:00 a.m.   HISTORY OF PRESENT ILLNESS:  The patient is a 40 year old Caucasian male  who was last admitted back in August of 2008 with abdominal pain.  He  was followed by Dr. Kendell Bane, but, however, because of his polysubstance  abuse, Dr. Kendell Bane actually discharged him from his office effective  April 01, 2007.  The patient mentioned that he is supposed to undergo  colonoscopy at [facility] on April 02, 2007.   He presents today with the onset of abdominal pain since about 4 o'clock  this morning.  He says he always has constant pain, but this morning it  was too severe for him to bear.  The pain was about 8/10 in intensity.  The pain was located in the mid-abdominal area, radiating everywhere and  also to his back.  He also had has nausea and vomiting but no  hematemesis.  He has had about 8 watery stools; 2 of them have had small  quantities of blood.  He had some chills but no fever.  He admits to  some weight loss which he is not able to quantify.  The patient was  taking doxycycline (he thinks it was doxycycline) until about a month  ago for a staph infection.   MEDICATIONS AT HOME:  1. Xanax 1 mg b.i.d., but he  says that he takes more than he is      prescribed.  2. Tylox 5/500 three times daily.  3. Zanaflex 4 mg b.i.d.  4. Zegerid daily.   ALLERGIES:  No known drug allergies.   PAST MEDICAL HISTORY:  1. Positive for anxiety.  2. Chronic low back pain.  3. Bipolar disorder.  4. Nephrolithiasis.  5. He is status post inguinal hernia repair.  6. He has got chronic GERD.   The last time he was here, the CAT scan suggested enteritis.  The  patient was to be evaluated for Crohn's disease; however, because of his  drug abuse, he has never undergone a colonoscopy.   SOCIAL HISTORY:  He lives in [place] with his mother.  He smokes a  half to one pack of cigarettes on a daily basis.  Occasional alcohol  use.  He says he used marijuana about 2  days ago.  He did cocaine 1 year  ago according to the patient.   FAMILY HISTORY:  Grandmother had some colon problems that are not very  clear at this time.   REVIEW OF SYSTEMS:  GENERAL:  This is positive for weakness and malaise.  HEENT:  Unremarkable.  CARDIOVASCULAR:  Unremarkable.  RESPIRATORY:  Unremarkable.  GI: As in HPI.  GU:  Unremarkable.  Other systems  unremarkable.   PHYSICAL EXAMINATION:  VITAL SIGNS:  Temperature 98.2, blood pressure  119/64, pulse rate 60s, respiratory rate of 16, saturation 98% on room  air.  GENERAL:  A thin white male in no distress.  HEENT:  There is no pallor, no icterus.  Oral mucous membranes are  moist.  No oral lesions are noted.  NECK:  Soft and supple.  No thyromegaly appreciated.  LUNGS:  Clear to auscultation bilaterally.  CARDIOVASCULAR:  S1 and S2 are normal, regular.  ABDOMEN:  Soft.  There is tenderness in the epigastric area.  No  rebound, rigidity or guarding is present.  Bowel sounds present.  EXTREMITIES:  No edema.  Peripheral pulses are palpable.  NEUROLOGIC:  The patient is alert, oriented x3.  No focal neurological  deficits are present.   LABORATORY DATA:  His white count is 27,000  with 94% neutrophils.  Hemoglobin is 18, hematocrit is 52, MCV is 89.  Platelet count - they  reported clumps but counts appear to be adequate.  His BMET shows a  glucose of 140; otherwise unremarkable.  UA was negative for infection.  He had an acute abdominal series done which was negative for any acute  process.   ASSESSMENT:  This is a 40 year old Caucasian male who has chronic  abdominal pain, the etiology of which is not clear.  He is supposed to  undergo a colonoscopy at [facility] on April 02, 2007.  It  has been suspected that he has Crohn's disease, but has never been  proven.  The patient could also have irritable bowel syndrome.  The  patient needs to be admitted for pain control.  I anticipate this will  be very short stay.   PLAN:  Abdominal pain.  He has severe leukocytosis which is likely the  cause of dehydration, but he does have pain in his belly, so we need to  rule out any intra-abdominal infection and abscess, so we will do a CAT  scan.  We will control his pain with Dilaudid.  We will hydrate him.  I  notice that his pancreatic enzymes have not been checked, which we will  check.  We will also check his LFTs and then, depending on the above  studies, further disposition and plan will be secured.  A urine drug  screen will be repeated.  This patient is likely noncompliant and  continues to do drugs.   Because he did recent antibiotics, though it was a month ago and it was  doxycycline, the risk of Clostridium difficile is a little bit low, but  still I will do stool studies for Clostridium difficile, culture  sensitivity, WBCs, ova and parasites.  The CAT scan will give [place] more  information.   We will consider a GI consult if his pain is not controlled easily or if  his CAT scan shows significant abnormalities.      Osvaldo Shipper, MD  Electronically Signed     GK/MEDQ  D:  03/11/2007  T:  03/11/2007  Job:  [id number]   cc:  [name],  M.D.  [address]  [place]  [place] [address]

## 2010-05-26 NOTE — Discharge Summary (Signed)
NAMEAMERE, BRICCO                   ACCOUNT NO.:  [id number]   MEDICAL RECORD NO.:  [id number]           PATIENT TYPE:   LOCATION:                                 FACILITY:   PHYSICIAN:  Ky Barban, M.D.DATE OF BIRTH:  25-Sep-1970   DATE OF ADMISSION:  DATE OF DISCHARGE:  LH                                 DISCHARGE SUMMARY   Mr. Boehle is a gentleman who came to the emergency room.  He is 40 years old.  He is complaining of left flank pain.  A CT scan, in the emergency room,  showed there is a 2-mm stone in the upper pole of the kidney and a 3-mm  stone in the left ureteropelvic junction without any hydronephrosis.  He was  having severe pain, so it was decided to admit him.  During the hospital, he  continued to complain of severe pain which was out of proportion to his  problem and I suspected maybe he is misusing narcotics but anyway, we kept  treating him symptomatically.  I told him the stone is very small and he  should wait to see if it can pass.  He agreed, and he was treated  symptomatically with pain medication.   His lab work showed a WBC count is 10.4, hematocrit is 44.6.  Sodium 140,  potassium 3.8, chloride 109, CO2 is 24, glucose 78, BUN 9, creatinine 0.7.  Urine shows 3-6 RBCs.  CT scan otherwise unremarkable, a 3-mm calculus at  the left ureteropelvic junction.  No hydronephrosis.  A 2-mm right upper  pole stone.  No stones in the ureters.   At this point, I encouraged him to take oral medicines.  I am going to  discharge him home.   FINAL DISCHARGE DIAGNOSES:  1.  Left and right renal calculi.  2.  Anxiety.   MEDICATIONS:  Tylox one q.6h. p.r.n. #30.   Report to the office in two weeks.      MIJ/MEDQ  D:  02/04/2004  T:  02/05/2004  Job:  [id number]

## 2010-05-26 NOTE — Discharge Summary (Signed)
[facility]  Patient:    [name], [name] Visit Number: [id number] MRN: [id number]          Service Type: Attending:  Alleen Borne, M.D. Dictated by:   Alleen Borne, M.D. Disc. Date: 02/11/01                             Discharge Summary  BRIEF HISTORY:  A 40 year old gentleman who came to the emergency room with 1 day history of having severe pain in his right flank.  A CT scan showed there was a 7 mm stone in the distal right ureter causing hydronephrosis.  The patient was admitted for further management and control of his pain.  HOSPITAL COURSE:  Routine admission work-up is done.  CBC, urinalysis, Astra-7, essentially normal.  He was started IV fluids and parenteral analgesia.  He continued to complain of pain.  He gave him the advise that we could go ahead and do stone basket but he said he wants to wait and see if he can pass the stone on its own, as one of his options, so I agreed.  I watched him for a couple more days.  He is not having his back pain and wants to go home.  I am going to discharge him.  FOLLOWUP:  We will follow him in the office.  I have told him if he feels more pain, or fever, to let me know.  FINAL DIAGNOSIS:  Right ureteral calculus.  DISCHARGE CONDITION:  Improved.  DISCHARGE MEDICATIONS:  Tylox 1 q.4-6h. p.r.n. #30.  FOLLOWUP:  Follow up in the office in 1 week. Dictated by:   Alleen Borne, M.D. Attending:  Alleen Borne, M.D. DD:  04/17/01 TD:  04/18/01 Job: [id number]

## 2010-05-26 NOTE — H&P (Signed)
[facility]  Patient:    [name], [name] Visit Number: [id number] MRN: [id number]          Service Type: MED Location: 3A A320 01 Attending Physician:  Alleen Borne I Dictated by:   Alleen Borne, M.D. Admit Date:  02/08/2001 Discharge Date: 02/11/2001                           History and Physical  CHIEF COMPLAINT:  Recurrent right flank pain.  HISTORY:  This 40 year old gentleman presented in the emergency room with one- day history of having severe pain in his right flank. A CT scan was done. It shows a 4-5 mm stone in the distal right ureter causing some hydronephrosis. This patient is admitted for further management and control of his pain.  PAST MEDICAL HISTORY:  Negative.  FAMILY HISTORY:  Negative.  ALLERGIES:  None.  MEDICATIONS:  Xanax.  REVIEW OF SYSTEMS:  Unremarkable.  PHYSICAL EXAMINATION:  VITAL SIGNS:  On examination, blood pressure is 130/80, temperature is normal.  CENTRAL NERVOUS SYSTEM:  Negative.  HEENT:  Negative.  CHEST:  Symmetrical.  HEART:  Regular sinus rhythm.  ABDOMEN:  Soft. Liver, spleen, kidneys are not palpable.  1+ right CVA tenderness.  EXTERNAL GENITALIA:  Circumcised, meatus adequate, testicles are normal.  RECTAL:  Exam deferred.  EXTREMITIES:  Normal.  IMPRESSION:  Right ureteral calculus.  PLAN:  IV fluids, parenteral analgesia.Dictated by:   Alleen Borne, M.D.  Attending Physician:  Alleen Borne I DD:  02/10/01 TD:  02/10/01 Job: [id number]

## 2010-05-26 NOTE — H&P (Signed)
NAMEDONTAVIOUS, EMILY                   ACCOUNT NO.:  [id number]   MEDICAL RECORD NO.:  [id number]          PATIENT TYPE:  INP   LOCATION:  A318                          FACILITY:  [facility]   PHYSICIAN:  Ky Barban, M.D.DATE OF BIRTH:  Jul 07, 1970   DATE OF ADMISSION:  12/19/2003  DATE OF DISCHARGE:  LH                                HISTORY & PHYSICAL   CHIEF COMPLAINT:  Left renal colic.   HISTORY OF PRESENT ILLNESS:  This 40 year old gentleman presented to the  emergency room with left renal colic.  He says it was going on for two weeks  and no fever, chills, or any voiding difficulty.  He has a strong history of  IV kidney stones.  He said, I have taken care of it 2-3 years ago, and he  passed a stone.  He was admitted for control of pain.   PAST MEDICAL HISTORY:  1.  History of kidney stones.  2.  No diabetes or hypertension.  3.  He does have anxiety.   PERSONAL HISTORY:  Negative.   REVIEW OF SYSTEMS:  Unremarkable.   PHYSICAL EXAMINATION:  VITAL SIGNS:  On examination blood pressure 130/80,  temperature normal.  CENTRAL NERVOUS SYSTEM:  No gross neurologic deficit.  HEENT/NECK:  __________negative.  CHEST:  Symmetrical.  HEART:  Regular sinus rhythm.  ABDOMEN:  Soft.  Liver, spleen, kidneys not palpable, 1+ q__________CVA  tenderness.  EXTERNAL GENITALIA:  Unremarkable.  RECTAL:  Exam deferred.  EXTREMITIES:  Normal.   IMPRESSION:  1.  Left renal colic.  2.  Anxiety.  CT scan of the abdomen shows 3 mm. Stone on the left      ureteropelvic junction, no hydronephrosis, 2 mm stone in the right      kidney.  No obstruction.   PLAN:  IV fluids, parenteral analgesia.     Moha   MIJ/MEDQ  D:  12/20/2003  T:  12/20/2003  Job:  [id number]

## 2010-05-26 NOTE — Procedures (Signed)
NAMEJABORI, HENEGAR                             ACCOUNT NO.:  [id number]   MEDICAL RECORD NO.:  [id number]                   PATIENT TYPE:  EMS   LOCATION:  ED                                   FACILITY:  [facility]   PHYSICIAN:  Edward L. Juanetta Gosling, M.D.             DATE OF BIRTH:  Oct 26, 1970   DATE OF PROCEDURE:  02/11/2003  DATE OF DISCHARGE:  02/11/2003                                EKG INTERPRETATION   1742.  The rhythm is sinus rhythm with a rate in the 70s.  Normal EKG.      ___________________________________________                                            Oneal Deputy Juanetta Gosling, M.D.   Gwenlyn Found  D:  02/17/2003  T:  02/17/2003  Job:  [id number]

## 2010-05-26 NOTE — H&P (Signed)
[facility]  Patient:    [name], [name] Visit Number: [id number] MRN: [id number]          Service Type: MED Location: 3A A305 01 Attending Physician:  Rosalyn Charters Dictated by:   Dennie Maizes, M.D. Admit Date:  02/18/2001   CC:         Elpidio Anis, M.D.   History and Physical  CHIEF COMPLAINT:  Severe right flank pain, low-grade fever, urinary frequency.  HISTORY OF PRESENT ILLNESS:  This 40 year old male was admitted to [facility] with severe right renal colic about 10 days ago.  He was admitted by Dr. Alleen Borne.  Evaluation at that time revealed a small left renal calculus without obstruction and a 7 x 4-mm-size right distal ureteral calculus at the level of the midpelvis.  After a few days of hospitalization, patient was discharged and sent home.  He could not pass the stone.  He has recurrent severe pain associated with low-grade fever and urinary frequency. He returned to the emergency room last night.  A CT of the abdomen and pelvis without contrast was repeated.  This revealed a 7 x 4-mm-size right distal ureteral calculus at the level of the ureterovesical junction.  A small left renal calculus was also noted.  The patients pain was not adequately controlled in the emergency room.  He was admitted to the hospital for pain control, further evaluation and IV fluids.  The patient had low-grade fever up to 99.4 degrees Fahrenheit at home.  He denied having any voiding difficulty, gross hematuria or dysuria.  PAST MEDICAL HISTORY:  No past history of urolithiasis.  History of back pain, status post right inguinal hernia repair in 2001.  MEDICATIONS: 1. Xanax 0.5 mg p.o. t.i.d. p.r.n. anxiety. 2. Prevacid 20 mg p.o. q.d. 3. Flexeril 10 mg one p.o. t.i.d.  ALLERGIES:  None.  PHYSICAL EXAMINATION:  HEENT:  Normal.  NECK:  No masses.  LUNGS:  Clear to auscultation.  HEART:  Regular rate and rhythm.  No  murmurs.  ABDOMEN:  Soft.  No palpable flank mass.  Mild right costovertebral angle tenderness is noted.  Bladder not palpable.  GU:  Penis normal.  Right inguinal incision is noted.  Testes are of normal size.  There is a small right varicocele.  IMPRESSION:  Right renal colic, right distal ureteral calculus with obstruction.  PLAN: 1. We are going to admit the patient to the hospital. 2. IV fluids. 3. P.r.n. narcotics. 4. Urine culture and sensitivity. 5. Will start the patient on Cipro 500 mg p.o. b.i.d. 6. Discussed with the patient and his family regarding treatment options.    Explained to him about observation for spontaneous passage of the calculus    as well as ureteroscopic stone extraction.  Patient would like to wait for    spontaneous passage of the calculus. Dictated by:   Dennie Maizes, M.D. Attending Physician:  Rosalyn Charters DD:  02/19/01 TD:  02/19/01 Job: [id number]

## 2010-05-26 NOTE — H&P (Signed)
[facility]  Patient:    [name], [name]                            MRN: [id number] Adm. Date:  02/08/00 Attending:  Francis Dowse A. Claudette Head, M.D. Dictator:   Young Berry. Scott, N.P.                   Psychiatric Admission Assessment  IDENTIFYING INFORMATION:  This is a 40 year old white male, single, involuntarily committed to the [facility] by his mother, who petitioned the court based on what she states has been escalating use of drugs by her son and increasingly antisocial behavior with him threatening to do property destruction if his mother would not give in to his demands for money and means of buying drugs.  Petition states that patient has recently been more despondent over his fiancee leaving him with a 57-year-old son and patient was found unconscious by his mother with a Xanax bottle and a loaded gun in the room at the time.  REASON FOR ADMISSION AND SYMPTOMS:  Today patient admits to recent feelings of depression over the past six months, secondary to his girlfriend leaving him. He admits that he has made statements at various times threatening to kill himself, and/or harm his fiancees new boyfriend, but insists that he had no intent by this and that they were casual statements made out of frustration. Patient denies that he took a Xanax overdose yesterday.  He states that the bottle is empty because he hid the pills and that he only took three Xanax prior to passing out.  Patient states that he did have a loaded gun in the room with him but he had no intent to shoot himself, that the gun is simply for target practice.  Patient does admit to increased stressors with his girlfriend recently, finding out that she had become pregnant by another man, and now the future of their relationship is unclear.  Patient admits to increasing frustration and sadness over the situation with the girlfriend, denies any changes in appetite, denies any changes in  sleep, admits to increased drug use recently of somewhat increase in alcohol, drinking 8-9 cans of beer per week or approximately one a day, and some increased use of cocaine which he is unable to specify.  Patient also notes that he buys pain relievers off the street and is unclear in what they are.  He states that he takes approximately 2-3 of those per week.  Patient also is depressed over the situation with living in his mothers trailer with his girlfriend and would like to change his living situation.  Patient today denies any suicidal ideation, denies any changes in sleep or appetite, denies any homicidal ideation, denies any auditory or visual hallucinations.  PAST PSYCHIATRIC HISTORY:  Patient was seen approximately one time many years ago at [facility], hospitalized x 1 at [facility] 15 years ago for alcohol detoxification and depression with suicidal intent and a plan to shoot himself at that time.  SOCIAL HISTORY:  Patient was born and raised in [place], educated through the 9th grade, works at [employer], working on well pumps.  Patient has never married.  He has an 75-year-old son who currently lives with his grandmother, who he has not seen in some time.  He has a 40-year-old son who currently lives with the sons mother, who is also his current fiancee, this  same fiancee who he recently found out is actually pregnant by another man. Patient has a history of legal charges secondary to the sale of marijuana and a DUI, all five years ago.  No current legal charges pending per patient report.  Patient currently lives in the trailer with his mother.  His girlfriend has been there also at least until now, and his 42-year-old son.  FAMILY HISTORY:  Positive for a father who was an alcoholic.  ALCOHOL AND DRUG HISTORY:  Alcohol history:  Patient reports drinking approximately 8-9 cans of beer per week.  He takes pain pills that he gets  off the street, approximately two or three of those per week.  He is unclear on what they are.  He has smoked marijuana since the age of 63, approximately two times a week, last three days ago, and uses an unspecified amount of cocaine, he says frequently about two times a week and whenever funds are available for him to purchase.  Patients report of substance intake seems to be variable between what he is saying today and what he told the nurses on admission to the floor, including that he takes _________ of marijuana, and also earlier reported that the last time he used cocaine was six months ago in spite of the fact that his urine is positive for this.  MEDICAL HISTORY:  Patient denies having any primary care Annetta Deiss.  He was last seen by a surgeon, Dr. Katrinka Blazing, in April of this past year for right inguinal hernia repair.  He states that Dr. Katrinka Blazing is the prescriber of the Xanax that he has been taking.  Patient states his prescription is for Xanax 0.5 mg to be taken t.i.d. for his nerves.  The Xanax is his only medication.  DRUG ALLERGIES:  No known drug allergies.  POSITIVE PHYSICAL FINDINGS:  Please see the physical exam done in the [facility] Emergency Room.  At that time it is noted that his urine drug screen was positive for opiates, benzos, and cocaine, and THC.  His alcohol was 6, his troponin level was 0.  His SGPT was 73.  Calcium was 8.4 and anion gap 18. His WBC was also 12.6 and he was treated empirically with a gram of Rocephin IV.  He had no symptoms of infection and his chest x-ray was clear.  MENTAL STATUS EXAMINATION:  This is a disheveled young male in a hospital gown, pleasant and cooperative, difficult to understand secondary to his rapid speech pace and he has articulation impediment.  His tone is normal without pressure.  He is calm and cooperative.  Mood is incongruently flippant and casual when discussing incidents leading up.  His thought process is  logical and coherent.  No evidence of psychosis, no psychotic symptoms, no evidence of suicidal ideation, no evidence of homicidal ideation.  His affect is full.  Cognitively he is oriented x 3 and is intact.  Insight is poor.  Judgment is poor.  DIAGNOSES: Axis I:    1. Polysubstance abuse.            2. Major depression, recurrent. Axis II:   Deferred. Axis III:  Status post overdose on Alprazolam. Axis IV:   Moderate, problems with his primary support group and problems            related to the social environment. Axis V:    Current 35, past year 30.  PLAN:  Admit for treatment of substance abuse and evaluation of his depression, symptoms,  q.61m. checks for safety.  We will monitor his WBC for resolution of that.  Goal is to assist him to strengthen his social support, to alleviate his depression, and detox him off benzos.  Will start him on Klonopin 0.5 b.i.d.  Will repeat his WBC and monitor him from there. Estimated length of stay is 3-4 days. DD:  02/09/00 TD:  02/10/00 Job: [id number]

## 2010-05-26 NOTE — Discharge Summary (Signed)
[facility]  Patient:    [place], [place] R. Visit Number: [id number] MRN: [id number]          Service Type: Attending:  Dennie Maizes, M.D. Dictated by:   Dennie Maizes, M.D. Adm. Date:  02/19/01 Disc. Date: 02/24/01                             Discharge Summary  FINAL DIAGNOSES: 1. Right distal ureteral calculus with obstruction, stone was passed. 2. Right renal colic.  OPERATIVE PROCEDURES:  None.  COMPLICATIONS:  None.  DISCHARGE SUMMARY:  This 40 year old male was admitted to [facility] with severe right renal colic about 10 days ago.  He was admitted by Dr. Alleen Borne.  Evaluation at that time revealed a small left renal calculus without obstruction and 7 x 4 mm size distal ureteral calculus at the level of the mid pelvis.  After a few days of hospitalization, the patient was discharged and sent home.  He could not pass the stone.  He had recurrent severe pain, associated with low grade fever, and urinary frequency.  He returned to the emergency room.  CT of the abdomen and pelvis without contrast was repeated.  This revealed a 7 x 4 mm size right distal ureteral calculus at the level of the ureterovesical junction.  A small left renal calculus was also noted.  The patients pain was not adequately controlled in the emergency room.  He was admitted to the hospital for pain control, further evaluation and IV fluids.  The patient had low-grade fever up to 99.4 degrees Fahrenheit at home.  He did not have any voiding difficulty, gross hematuria or dysuria.  PAST MEDICAL HISTORY: 1. No history of urolithiasis in the past. 2. History of back pain status post right inguinal hernia repair in 2001.  MEDICATIONS: 1. Xanax 0.5 mg p.o. t.i.d. p.r.n. anxiety. 2. Prevacid 20 mg p.o. q.d. 3. Flexeril 10 mg 1  p.o. t.i.d.  ALLERGIES:  None.  PHYSICAL EXAMINATION:  HEENT:  Head, eyes, ears, nose, and throat normal.  NECK:  No  masses.  LUNGS:  Clear to auscultation.  HEART:  Regular rate and rhythm, no murmurs.  ABDOMEN:  Soft, no palpable flank mass.  MIld right costovertebral angle tenderness was noted.  Bladder not palpable.  PENIS:  Normal.  Right inguinal incision is noted.  TESTES:  Normal size.  There is a small right varicocele.  HOSPITAL COURSE:  The patient was admitted to the hospital and treated with IV fluids and parenteral narcotics.  Urine culture and sensitivity was done and the patient was started on Cipro 500 mg p.o. b.i.d.  The patient complained of intermittent severe pain and he was treated with PCA pump.  P.o. Toradol was also given for additional pain relief.  An x-ray of the KUB area was done on February 24, 2001, which revealed absence of the right ureteral stone.  The patient said he had passed a stone in the urine.  The stone was sent for chemical analysis.  The patient was discharged and sent home on February 24, 2001.  He was given Toradol 10 mg 1 p.o. t.i.d. for 3 days.  The patient will call me for any severe pain, fever, chills, or voiding difficulty. Dictated by:   Dennie Maizes, M.D. Attending:  Dennie Maizes, M.D. DD:  05/26/01 TD:  05/27/01 Job: [id number]

## 2010-06-14 ENCOUNTER — Encounter: Payer: Self-pay | Admitting: Family Medicine

## 2010-06-15 ENCOUNTER — Ambulatory Visit (INDEPENDENT_AMBULATORY_CARE_PROVIDER_SITE_OTHER): Payer: Self-pay | Admitting: Family Medicine

## 2010-06-15 ENCOUNTER — Encounter: Payer: Self-pay | Admitting: Family Medicine

## 2010-06-15 VITALS — BP 120/70 | HR 65 | Resp 16 | Ht 72.5 in | Wt 186.1 lb

## 2010-06-15 DIAGNOSIS — F319 Bipolar disorder, unspecified: Secondary | ICD-10-CM

## 2010-06-15 DIAGNOSIS — F17200 Nicotine dependence, unspecified, uncomplicated: Secondary | ICD-10-CM

## 2010-06-15 DIAGNOSIS — F411 Generalized anxiety disorder: Secondary | ICD-10-CM

## 2010-06-15 DIAGNOSIS — R5381 Other malaise: Secondary | ICD-10-CM

## 2010-06-15 DIAGNOSIS — R5383 Other fatigue: Secondary | ICD-10-CM

## 2010-06-15 DIAGNOSIS — M549 Dorsalgia, unspecified: Secondary | ICD-10-CM

## 2010-06-15 DIAGNOSIS — Z13220 Encounter for screening for lipoid disorders: Secondary | ICD-10-CM

## 2010-06-15 DIAGNOSIS — F1910 Other psychoactive substance abuse, uncomplicated: Secondary | ICD-10-CM

## 2010-06-15 MED ORDER — HYDROCODONE-ACETAMINOPHEN 10-500 MG PO TABS: ORAL_TABLET | ORAL | Status: DC

## 2010-06-15 NOTE — Patient Instructions (Signed)
F/u in 3 months.  You need to absolutely take the pain med only as prescribed, there will be no early fills.  pls go to mental health or the eD if you realize you are getting into mental health issues i will refer yoou to behav health in [place]
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
## 2010-06-19 ENCOUNTER — Encounter: Payer: Self-pay | Admitting: Family Medicine

## 2010-06-20 ENCOUNTER — Telehealth: Payer: Self-pay | Admitting: Family Medicine

## 2010-06-20 NOTE — Telephone Encounter (Signed)
pls let this pt know that as I already said, I will NOT be prescribing any med for his anxiety or bipolar disease, he ABSOLUTELY needs to see psychiatry.  Also pl let him know that the psychiatrist next door called me personally to let me know that he will not be able to see him , he NEEDS to go to [facility], [place] will have to call for that appt himself, and I advise him that since he states he is [facility], he needs to go there and ask for emergency evaluation. PLS also let him know that behavioral health in  will not see him, This is what Dr Lolly Mustache told me, he called me personally today

## 2010-06-21 NOTE — Telephone Encounter (Signed)
Called patient, left message.

## 2010-06-21 NOTE — Telephone Encounter (Signed)
Patient is aware and states [facility] does not help and he thinks he is going to try to go to a psychiatrist on his own.

## 2010-06-24 NOTE — Assessment & Plan Note (Signed)
unchanged unwilling to quit at this time

## 2010-06-24 NOTE — Assessment & Plan Note (Signed)
Unchanged, pain med in limited quantities

## 2010-06-24 NOTE — Assessment & Plan Note (Signed)
Deteriorated, requesting xanax, but i have made it clear I will not prescribe mental health med for him he needs psych
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
## 2010-06-26 ENCOUNTER — Encounter: Payer: Self-pay | Admitting: Family Medicine

## 2010-06-26 ENCOUNTER — Telehealth: Payer: Self-pay

## 2010-07-03 NOTE — Telephone Encounter (Signed)
Error, no message

## 2010-07-11 ENCOUNTER — Ambulatory Visit (HOSPITAL_COMMUNITY): Payer: Self-pay | Admitting: Psychiatry

## 2010-07-24 ENCOUNTER — Emergency Department (HOSPITAL_COMMUNITY)
Admission: EM | Admit: 2010-07-24 | Discharge: 2010-07-24 | Discharge status: Against Medical Advice | Payer: Self-pay | Attending: *Deleted | Admitting: *Deleted

## 2010-07-24 ENCOUNTER — Encounter (HOSPITAL_COMMUNITY): Payer: Self-pay | Admitting: *Deleted

## 2010-07-24 DIAGNOSIS — R109 Unspecified abdominal pain: Secondary | ICD-10-CM | POA: Insufficient documentation

## 2010-07-24 DIAGNOSIS — M549 Dorsalgia, unspecified: Secondary | ICD-10-CM | POA: Insufficient documentation

## 2010-07-24 HISTORY — DX: Crohn's disease, unspecified, without complications: K50.90

## 2010-07-24 NOTE — ED Notes (Signed)
3rd attempt pt not in waiting room.

## 2010-07-24 NOTE — ED Notes (Signed)
Pt still not in waiting room 2nd attempt

## 2010-07-24 NOTE — ED Notes (Signed)
Pt not in waiting room 1st attempt 

## 2010-07-26 ENCOUNTER — Other Ambulatory Visit: Payer: Self-pay | Admitting: Family Medicine

## 2010-07-28 ENCOUNTER — Other Ambulatory Visit: Payer: Self-pay | Admitting: Family Medicine

## 2010-08-02 ENCOUNTER — Telehealth: Payer: Self-pay | Admitting: Family Medicine

## 2010-08-02 NOTE — Telephone Encounter (Signed)
Called patient no answer.

## 2010-08-02 NOTE — Telephone Encounter (Signed)
Feeling a lot better now. Was sleeping a lot but he is feeling a lot better now

## 2010-08-08 ENCOUNTER — Other Ambulatory Visit: Payer: Self-pay | Admitting: Family Medicine

## 2010-08-08 NOTE — Telephone Encounter (Signed)
Patient states he needs his zanaflex refilled. Also states he is trying to get an appt with [facility] for mental health. He states Dr Lodema Hong referred him to behavioral health and then told them not to accept him. States he needs zanax or diazepam for his insomnia until he can see someone in mental health

## 2010-08-09 NOTE — Telephone Encounter (Signed)
See response to [facility]

## 2010-08-09 NOTE — Telephone Encounter (Signed)
pls refill the zannaflex x 2 .  pls notify pt that iot was the decision of [facility], not me , as far as not seeing him

## 2010-08-22 ENCOUNTER — Telehealth: Payer: Self-pay | Admitting: Family Medicine

## 2010-08-22 MED ORDER — PENICILLIN V POTASSIUM 500 MG PO TABS: 500.0000 mg | ORAL_TABLET | Freq: Four times a day (QID) | ORAL | Status: AC

## 2010-08-22 NOTE — Telephone Encounter (Signed)
He has called twice about getting Penicillin for his tooth. Do you want him to come in for OV first?

## 2010-08-22 NOTE — Telephone Encounter (Signed)
agree

## 2010-08-22 NOTE — Telephone Encounter (Signed)
Patient advised and sent in RX. Wants 3 or 4 days of pain medicine filled sent in for the pain in his teeth. I told him that you would not refill any pain meds until they were due.

## 2010-08-22 NOTE — Telephone Encounter (Signed)
Advise he needs to see dentist and send in penicillin 500mg  one 3 times daily #30 only, I will NOT be refilling this let him know

## 2010-08-28 ENCOUNTER — Encounter: Payer: Self-pay | Admitting: Family Medicine

## 2010-08-29 ENCOUNTER — Encounter: Payer: Self-pay | Admitting: Family Medicine

## 2010-09-06 ENCOUNTER — Other Ambulatory Visit: Payer: Self-pay | Admitting: Family Medicine

## 2010-09-08 ENCOUNTER — Telehealth: Payer: Self-pay | Admitting: Family Medicine

## 2010-09-08 NOTE — Telephone Encounter (Signed)
Med sent in for 15 day supply

## 2010-09-20 ENCOUNTER — Telehealth: Payer: Self-pay

## 2010-09-20 MED ORDER — HYDROCODONE-ACETAMINOPHEN 10-500 MG PO TABS: ORAL_TABLET | ORAL | Status: DC

## 2010-09-20 NOTE — Telephone Encounter (Signed)
pls send in for two weeks only , explain to pt that is the end of the med from this office and I wish him all the best

## 2010-09-20 NOTE — Telephone Encounter (Signed)
Patient came in wanting to be seen, Per Aurea Graff, states he was unaware he was discharged. His medicine runs out today since he only gets on a 2 week basis. Last picked up on 8/29. Asking for enough hydrocodone to last until he is officially discharged from the office. Doesn't want to be seen now or go to the ER. The hydrocodone is all he is asking for.

## 2010-09-20 NOTE — Telephone Encounter (Signed)
Gave rx for 2 weeks only

## 2010-09-28 ENCOUNTER — Telehealth: Payer: Self-pay | Admitting: Family Medicine

## 2010-09-28 NOTE — Telephone Encounter (Signed)
pls let him know this is not possible.I tried to call myself , his mailbox is full. If he becomes unreasonable in any way, then let management know, thanks

## 2010-09-28 NOTE — Telephone Encounter (Signed)
Maybe office manager needs to have another discussion with this patient.

## 2010-10-02 LAB — RAPID URINE DRUG SCREEN, HOSP PERFORMED
Barbiturates: NOT DETECTED
Benzodiazepines: POSITIVE — AB
Cocaine: NOT DETECTED
Opiates: POSITIVE — AB
Tetrahydrocannabinol: POSITIVE — AB

## 2010-10-02 LAB — CBC
HCT: 52.1 — ABNORMAL HIGH
Hemoglobin: 13.3
MCHC: 34.2
MCHC: 34.5
MCV: 89.5
MCV: 90.2
MCV: 89.7
RBC: 3.84 — ABNORMAL LOW
RBC: 4.2 — ABNORMAL LOW
RBC: 4.56
RDW: 13
WBC: 10.2
WBC: 8.2
WBC: 9.7

## 2010-10-02 LAB — BASIC METABOLIC PANEL
BUN: 18
CO2: 22
CO2: 33 — ABNORMAL HIGH
Calcium: 8.7
Chloride: 107
Creatinine, Ser: 0.78
Creatinine, Ser: 0.87
GFR calc Af Amer: >60

## 2010-10-02 LAB — URINALYSIS, ROUTINE W REFLEX MICROSCOPIC
Glucose, UA: NEGATIVE
Glucose, UA: NEGATIVE
Hgb urine dipstick: NEGATIVE
Ketones, ur: NEGATIVE
Nitrite: NEGATIVE
Protein, ur: NEGATIVE
Protein, ur: NEGATIVE
pH: 5.5

## 2010-10-02 LAB — IGG, IGA, IGM
IgA: 178
IgG (Immunoglobin G), Serum: 920
IgM, Serum: 84

## 2010-10-02 LAB — COMPREHENSIVE METABOLIC PANEL
ALT: 18
ALT: 18
AST: 19
AST: 23
Alkaline Phosphatase: 61
CO2: 29
CO2: 25
Chloride: 103
Chloride: 106
Creatinine, Ser: 0.83
Creatinine, Ser: 0.83
GFR calc Af Amer: >60
GFR calc Af Amer: >60
GFR calc non Af Amer: >60
GFR calc non Af Amer: >60
Glucose, Bld: 100 — ABNORMAL HIGH
Potassium: 3.5
Sodium: 139
Total Bilirubin: 0.6
Total Bilirubin: 0.6

## 2010-10-02 LAB — DIFFERENTIAL
Basophils Absolute: 0
Basophils Absolute: 0
Basophils Relative: 0
Basophils Relative: 0
Eosinophils Absolute: 0
Eosinophils Absolute: 0.4
Eosinophils Absolute: 0.3
Eosinophils Relative: 4
Eosinophils Relative: 3
Lymphocytes Relative: 31
Monocytes Absolute: 0.5
Monocytes Absolute: 0.6
Monocytes Relative: 9
Neutro Abs: 5
Neutrophils Relative %: 62
Neutrophils Relative %: 94 — ABNORMAL HIGH

## 2010-10-02 LAB — LIPASE, BLOOD
Lipase: 18
Lipase: 21
Lipase: 19

## 2010-10-02 LAB — CLOSTRIDIUM DIFFICILE EIA: C difficile Toxins A+B, EIA: NEGATIVE

## 2010-10-02 LAB — OVA AND PARASITE EXAMINATION

## 2010-10-02 LAB — HEPATIC FUNCTION PANEL
AST: 27
Albumin: 4
Total Bilirubin: 0.7
Total Protein: 6.8

## 2010-10-02 LAB — STOOL CULTURE

## 2010-10-02 LAB — AMYLASE: Amylase: 52

## 2010-10-02 LAB — ETHANOL: Alcohol, Ethyl (B): 135 — ABNORMAL HIGH

## 2010-10-02 LAB — TISSUE TRANSGLUTAMINASE, IGA: Tissue Transglutaminase Ab, IgA: 0.3 U/mL (ref <7)

## 2010-10-03 NOTE — Telephone Encounter (Signed)
Patient has not called back to office regarding this

## 2010-10-04 LAB — COMPREHENSIVE METABOLIC PANEL
Albumin: 3.8
BUN: 8
Calcium: 9
Creatinine, Ser: 0.87
Total Bilirubin: 0.2 — ABNORMAL LOW
Total Protein: 6.6

## 2010-10-04 LAB — RAPID URINE DRUG SCREEN, HOSP PERFORMED
Barbiturates: NOT DETECTED
Benzodiazepines: POSITIVE — AB
Cocaine: NOT DETECTED

## 2010-10-04 LAB — CBC
HCT: 38.5 — ABNORMAL LOW
MCHC: 35.3
MCV: 91.2
Platelets: 252
RDW: 13.5

## 2010-10-04 LAB — DIFFERENTIAL
Basophils Absolute: 0
Lymphocytes Relative: 40
Monocytes Absolute: 0.4
Monocytes Relative: 6
Neutro Abs: 3.7

## 2010-10-06 LAB — DIFFERENTIAL
Basophils Absolute: 0
Basophils Relative: 1
Eosinophils Absolute: 0.4
Eosinophils Absolute: 0.5
Eosinophils Relative: 4
Eosinophils Relative: 4
Lymphocytes Relative: 32
Lymphocytes Relative: 24
Lymphs Abs: 3
Monocytes Absolute: 0.8

## 2010-10-06 LAB — BASIC METABOLIC PANEL
BUN: 7
GFR calc non Af Amer: >60
Glucose, Bld: 95
Potassium: 3.9

## 2010-10-06 LAB — RAPID URINE DRUG SCREEN, HOSP PERFORMED
Amphetamines: NOT DETECTED
Benzodiazepines: POSITIVE — AB
Cocaine: NOT DETECTED
Opiates: NOT DETECTED
Tetrahydrocannabinol: POSITIVE — AB
Tetrahydrocannabinol: POSITIVE — AB

## 2010-10-06 LAB — COMPREHENSIVE METABOLIC PANEL
ALT: 27
AST: 24
Albumin: 4.2
CO2: 26
Calcium: 9.7
Chloride: 112
GFR calc Af Amer: >60
GFR calc non Af Amer: >60
Sodium: 145

## 2010-10-06 LAB — URINALYSIS, ROUTINE W REFLEX MICROSCOPIC
Ketones, ur: NEGATIVE
Leukocytes, UA: NEGATIVE
Nitrite: NEGATIVE
Protein, ur: NEGATIVE
Urobilinogen, UA: 0.2

## 2010-10-06 LAB — CBC
HCT: 47
MCHC: 34.2
MCHC: 33.9
MCV: 92.8
Platelets: 307
Platelets: 313
RBC: 5.27
RDW: 13.1
WBC: 12.4 — ABNORMAL HIGH

## 2010-10-06 LAB — LIPASE, BLOOD: Lipase: 24

## 2010-10-10 LAB — DIFFERENTIAL
Basophils Absolute: 0
Basophils Relative: 0
Eosinophils Absolute: 0.3
Eosinophils Relative: 3
Monocytes Absolute: 0.6

## 2010-10-10 LAB — BASIC METABOLIC PANEL
BUN: 7
CO2: 25
Chloride: 112
GFR calc non Af Amer: >60
Glucose, Bld: 97
Potassium: 3.8
Sodium: 145

## 2010-10-10 LAB — CBC
HCT: 43.7
Hemoglobin: 15.1
MCHC: 34.5
MCV: 91.2
Platelets: 373
RDW: 13.8

## 2010-10-10 LAB — RAPID URINE DRUG SCREEN, HOSP PERFORMED
Amphetamines: NOT DETECTED
Opiates: NOT DETECTED
Tetrahydrocannabinol: POSITIVE — AB

## 2010-10-10 LAB — URINALYSIS, ROUTINE W REFLEX MICROSCOPIC
Bilirubin Urine: NEGATIVE
Ketones, ur: NEGATIVE
Leukocytes, UA: NEGATIVE
Nitrite: NEGATIVE
Protein, ur: NEGATIVE
pH: 5.5

## 2010-10-18 LAB — WOUND CULTURE

## 2010-10-19 LAB — RAPID URINE DRUG SCREEN, HOSP PERFORMED
Amphetamines: NOT DETECTED
Barbiturates: NOT DETECTED
Benzodiazepines: POSITIVE — AB
Tetrahydrocannabinol: POSITIVE — AB

## 2010-10-19 LAB — BASIC METABOLIC PANEL
BUN: 12
CO2: 27
Calcium: 8.4
Creatinine, Ser: 0.82
GFR calc non Af Amer: >60
Glucose, Bld: 113 — ABNORMAL HIGH
Sodium: 136

## 2010-10-20 LAB — URINALYSIS, ROUTINE W REFLEX MICROSCOPIC
Nitrite: NEGATIVE
Specific Gravity, Urine: >1.03 — ABNORMAL HIGH
Urobilinogen, UA: 0.2

## 2010-10-20 LAB — COMPREHENSIVE METABOLIC PANEL
Albumin: 3.4 — ABNORMAL LOW
Alkaline Phosphatase: 43
Alkaline Phosphatase: 52
BUN: 22
BUN: 5 — ABNORMAL LOW
Chloride: 103
Creatinine, Ser: 1.13
Glucose, Bld: 156 — ABNORMAL HIGH
Potassium: 3.5
Potassium: 4
Total Bilirubin: 0.7
Total Protein: 6.1

## 2010-10-20 LAB — LIPASE, BLOOD: Lipase: 10 — ABNORMAL LOW

## 2010-10-20 LAB — CBC
HCT: 40.6
HCT: 51.9
Hemoglobin: 17.7 — ABNORMAL HIGH
MCV: 89.3
Platelets: 242
RDW: 13
RDW: 13.1

## 2010-10-20 LAB — BASIC METABOLIC PANEL
BUN: 5 — ABNORMAL LOW
Calcium: 8.6
Creatinine, Ser: 0.78
GFR calc non Af Amer: >60
Glucose, Bld: 85
Potassium: 3.8

## 2010-10-20 LAB — DIFFERENTIAL
Basophils Absolute: 0
Basophils Relative: 0
Lymphocytes Relative: 16
Neutro Abs: 10 — ABNORMAL HIGH
Neutrophils Relative %: 76

## 2010-10-20 LAB — RAPID URINE DRUG SCREEN, HOSP PERFORMED
Opiates: POSITIVE — AB
Tetrahydrocannabinol: POSITIVE — AB

## 2010-10-20 LAB — AMYLASE: Amylase: 31

## 2010-10-25 LAB — DIFFERENTIAL
Blasts: 0
Eosinophils Absolute: 0.4
Eosinophils Relative: 3
Lymphocytes Relative: 31
Lymphs Abs: 3.8 — ABNORMAL HIGH
Metamyelocytes Relative: 0
Monocytes Absolute: 0.7
Monocytes Relative: 6
Neutro Abs: 7.1
Neutrophils Relative %: 59
nRBC: 0

## 2010-10-25 LAB — BASIC METABOLIC PANEL
CO2: 20
Calcium: 9
Chloride: 107
Creatinine, Ser: 0.73
GFR calc Af Amer: >60
GFR calc non Af Amer: >60
Sodium: 139

## 2010-10-25 LAB — RAPID URINE DRUG SCREEN, HOSP PERFORMED
Amphetamines: NOT DETECTED
Opiates: NOT DETECTED
Tetrahydrocannabinol: NOT DETECTED

## 2010-10-25 LAB — CBC
Platelets: 377
RDW: 13.5
WBC: 12.1 — ABNORMAL HIGH

## 2010-10-31 ENCOUNTER — Emergency Department (HOSPITAL_COMMUNITY)
Admission: EM | Admit: 2010-10-31 | Discharge: 2010-10-31 | Disposition: A | Discharge status: Home / Self Care | Payer: Self-pay | Attending: Emergency Medicine | Admitting: Emergency Medicine

## 2010-10-31 ENCOUNTER — Encounter (HOSPITAL_COMMUNITY): Payer: Self-pay

## 2010-10-31 DIAGNOSIS — F17200 Nicotine dependence, unspecified, uncomplicated: Secondary | ICD-10-CM | POA: Insufficient documentation

## 2010-10-31 DIAGNOSIS — M549 Dorsalgia, unspecified: Secondary | ICD-10-CM

## 2010-10-31 DIAGNOSIS — X038XXA Other exposure to controlled fire, not in building or structure, initial encounter: Secondary | ICD-10-CM | POA: Insufficient documentation

## 2010-10-31 DIAGNOSIS — F1910 Other psychoactive substance abuse, uncomplicated: Secondary | ICD-10-CM | POA: Insufficient documentation

## 2010-10-31 DIAGNOSIS — Y92009 Unspecified place in unspecified non-institutional (private) residence as the place of occurrence of the external cause: Secondary | ICD-10-CM | POA: Insufficient documentation

## 2010-10-31 DIAGNOSIS — T2124XA Burn of second degree of lower back, initial encounter: Secondary | ICD-10-CM | POA: Insufficient documentation

## 2010-10-31 DIAGNOSIS — K5090 Crohn's disease, unspecified, without complications: Secondary | ICD-10-CM | POA: Insufficient documentation

## 2010-10-31 DIAGNOSIS — R296 Repeated falls: Secondary | ICD-10-CM | POA: Insufficient documentation

## 2010-10-31 MED ORDER — HYDROMORPHONE HCL 2 MG/ML IJ SOLN
2.0000 mg | Freq: Once | INTRAMUSCULAR | Status: AC
  Administered 2010-10-31: 2 mg via INTRAMUSCULAR
  Filled 2010-10-31: qty 1

## 2010-10-31 MED ORDER — HYDROCODONE-ACETAMINOPHEN 5-325 MG PO TABS: 1.0000 | ORAL_TABLET | Freq: Four times a day (QID) | ORAL | Status: AC | PRN
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
## 2010-10-31 NOTE — ED Notes (Signed)
No adverse reaction from IM injection
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
## 2011-01-30 ENCOUNTER — Encounter (HOSPITAL_COMMUNITY): Payer: Self-pay | Admitting: Emergency Medicine

## 2011-01-30 ENCOUNTER — Emergency Department (HOSPITAL_COMMUNITY)
Admission: EM | Admit: 2011-01-30 | Discharge: 2011-01-31 | Disposition: A | Discharge status: Home / Self Care | Payer: Self-pay | Attending: Emergency Medicine | Admitting: Emergency Medicine

## 2011-01-30 ENCOUNTER — Emergency Department (HOSPITAL_COMMUNITY): Payer: Self-pay

## 2011-01-30 DIAGNOSIS — F1910 Other psychoactive substance abuse, uncomplicated: Secondary | ICD-10-CM | POA: Insufficient documentation

## 2011-01-30 DIAGNOSIS — F17200 Nicotine dependence, unspecified, uncomplicated: Secondary | ICD-10-CM | POA: Insufficient documentation

## 2011-01-30 DIAGNOSIS — Z79899 Other long term (current) drug therapy: Secondary | ICD-10-CM | POA: Insufficient documentation

## 2011-01-30 DIAGNOSIS — F319 Bipolar disorder, unspecified: Secondary | ICD-10-CM | POA: Insufficient documentation

## 2011-01-30 DIAGNOSIS — X58XXXA Exposure to other specified factors, initial encounter: Secondary | ICD-10-CM | POA: Insufficient documentation

## 2011-01-30 DIAGNOSIS — F10929 Alcohol use, unspecified with intoxication, unspecified: Secondary | ICD-10-CM

## 2011-01-30 DIAGNOSIS — S82891A Other fracture of right lower leg, initial encounter for closed fracture: Secondary | ICD-10-CM

## 2011-01-30 DIAGNOSIS — K5090 Crohn's disease, unspecified, without complications: Secondary | ICD-10-CM | POA: Insufficient documentation

## 2011-01-30 MED ORDER — LORAZEPAM 2 MG/ML IJ SOLN
2.0000 mg | Freq: Once | INTRAMUSCULAR | Status: AC
  Administered 2011-01-30: 2 mg via INTRAMUSCULAR

## 2011-01-30 MED ORDER — OXYCODONE-ACETAMINOPHEN 5-325 MG PO TABS
1.0000 | ORAL_TABLET | Freq: Once | ORAL | Status: AC
  Administered 2011-01-30: 1 via ORAL
  Filled 2011-01-30: qty 1

## 2011-01-30 MED ORDER — LORAZEPAM 2 MG/ML IJ SOLN
INTRAMUSCULAR | Status: AC
  Administered 2011-01-30: 2 mg via INTRAMUSCULAR
  Filled 2011-01-30: qty 1

## 2011-01-30 NOTE — ED Notes (Signed)
Pt has ripped off c-collar.  Collar re-applied.

## 2011-01-30 NOTE — ED Notes (Signed)
Pt taken to xray 

## 2011-01-30 NOTE — ED Notes (Signed)
Pt has ripped c-collar off of neck for the second time and refuses to wear C-collar

## 2011-01-30 NOTE — ED Provider Notes (Signed)
History   This chart was scribed for [name] Gaskins, MD by Clarita Crane. The patient was seen in room APA16A/APA16A and the patient's care was started at 9:36PM.   CSN: [id number]  Arrival date & time 01/30/11  2052   First MD Initiated Contact with Patient 01/30/11 2118      Chief Complaint  Patient presents with  . Alcohol Intoxication  . Ankle Pain    The history is provided by the patient. The history is limited by the condition of the patient.   A Level 5 Caveat Applies due to Condition of Patient (Intoxication) [name] is a 41 y.o. male who presents to the Emergency Department complaining of right ankle pain onset tonight and persistent since. Patient admits to use of ETOH tonight.  No other details are known except for possible fall Past Medical History  Diagnosis Date  . Other, mixed, or unspecified nondependent drug abuse, unspecified   . Personal history of unspecified digestive disease   . Abdominal pain, unspecified site   . Bipolar disorder, unspecified   . Backache, unspecified   . Tobacco use disorder   . Crohn's     Past Surgical History  Procedure Date  . Right inguinal hernia repair     Family History  Problem Relation Age of Onset  . Lung cancer Father   . Cancer Father     lung  . Heart disease Mother     History  Substance Use Topics  . Smoking status: Current Everyday Smoker    Types: Cigarettes  . Smokeless tobacco: Not on file  . Alcohol Use: Yes     pt states he quit 5 months ago      Review of Systems  Unable to perform ROS: Mental status change    Allergies  Fluoxetine hcl and Ketorolac tromethamine  Home Medications   Current Outpatient Rx  Name Route Sig Dispense Refill  . ALBUTEROL SULFATE HFA 108 (90 BASE) MCG/ACT IN AERS Inhalation Inhale 2 puffs into the lungs every 6 (six) hours as needed. for shortness of breath    . HYDROCODONE-ACETAMINOPHEN 10-500 MG PO TABS Oral Take 1 tablet by mouth every 8 (eight) hours  as needed. For pain    . OMEPRAZOLE 20 MG PO CPDR Oral Take 20 mg by mouth daily as needed. For indigestion     . ZANAFLEX 4 MG PO TABS  TAKE 1 TABLET EVERY 12   HOURS AS NEEDED FORMUSCLE SPASMS. 60 each 1    BP 117/84  Pulse 114  Temp(Src) 98.3 F (36.8 C) (Oral)  Resp 24  Ht 5\' 8"  (1.727 m)  Wt 180 lb (81.647 kg)  BMI 27.37 kg/m2  SpO2 99%  Physical Exam CONSTITUTIONAL: Well developed/well nourished, smells of alcohol HEAD AND FACE: Normocephalic/atraumatic EYES: EOMI/PERRL ENMT: Mucous membranes moist, No evidence of facial/nasal trauma NECK: supple no meningeal signs SPINE:entire spine nontender, no signs of trauma noted CV: S1/S2 noted, no murmurs/rubs/gallops noted LUNGS: Lungs are clear to auscultation bilaterally, no apparent distress ABDOMEN: soft, nontender, no rebound or guarding NEURO: Pt is awake/alert, moves all extremitiesx4 EXTREMITIES: pulses normal, full ROM, DP and PT pulses intact, popliteal pulses intact, swelling noted to right lateral malleolus, no proximal fibula tenderness to right lower extremity, All other extremities/joints palpated/ranged and nontender SKIN: warm, color normal PSYCH: no abnormalities of mood noted  ED Course  Procedures   DIAGNOSTIC STUDIES: Oxygen Saturation is 99% on room air, normal by my interpretation.    COORDINATION  OF CARE:  9:57 PM Pt stable, more comfortable though admits to ETOH and he smells of ETOH Other than right ankle, no signs of trauma He is unable to tell me what happened No signs of head injury Will place in c-collar, splint right ankle, and monitor patient in ED Will defer further workup as no signs of trauma, otherwise stable and wait for him to sober  12:09 AM Pt resting comfortably, required ativan due to agitation but now sleeping, stable D/w dr Fredricka Bonine, will re-eval patient once sober   Labs Reviewed - No data to display Dg Ankle Complete Right  01/30/2011  *RADIOLOGY REPORT*  Clinical Data:  Ankle pain.  RIGHT ANKLE - COMPLETE 3+ VIEW  Comparison: None.  Findings: There is lateral soft tissue swelling with a nondisplaced distal fibular fracture identified.  No other acute bony or joint abnormality is seen.  IMPRESSION: Nondisplaced lateral malleolus fracture.  Original Report Authenticated By: Bernadene Bell. D'ALESSIO, M.D.      MDM  Nursing notes reviewed and considered in documentation xrays reviewed and considered       I personally performed the services described in this documentation, which was scribed in my presence. The recorded information has been reviewed and considered.      [name] Gaskins, MD 01/31/11 0010

## 2011-01-30 NOTE — ED Notes (Signed)
Pt returned from xray
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
## 2011-01-31 LAB — POCT I-STAT, CHEM 8
BUN: 11 mg/dL (ref 6–23)
Chloride: 109 mEq/L (ref 96–112)
Creatinine, Ser: 1.1 mg/dL (ref 0.50–1.35)
Glucose, Bld: 91 mg/dL (ref 70–99)
Hemoglobin: 15 g/dL (ref 13.0–17.0)
Potassium: 4 mEq/L (ref 3.5–5.1)
Sodium: 147 mEq/L — ABNORMAL HIGH (ref 135–145)

## 2011-01-31 MED ORDER — OXYCODONE-ACETAMINOPHEN 5-325 MG PO TABS: 2.0000 | ORAL_TABLET | ORAL | Status: AC | PRN

## 2011-01-31 MED ORDER — OXYCODONE-ACETAMINOPHEN 5-325 MG PO TABS
2.0000 | ORAL_TABLET | Freq: Once | ORAL | Status: AC
  Administered 2011-01-31: 2 via ORAL
  Filled 2011-01-31: qty 2

## 2011-01-31 NOTE — ED Notes (Signed)
Pt still c/o pain in his right ankle and saying that his pain meds are not working. Pt notifed that it had not been time for them to work and that it would help if he kept his ankle elevated with ice. Pt sitting on side of stretcher with leg hanging over the side.

## 2011-01-31 NOTE — ED Notes (Signed)
Dr. Fredricka Bonine in to talk with patient. Pt calling his son to come and pick him up.

## 2011-01-31 NOTE — ED Notes (Signed)
Pt sleeping no sx/signs of distress noted; 02 sats 95% on room air

## 2011-01-31 NOTE — ED Notes (Signed)
Pt awake and alert at this time and eating breakfast. Pt states that he doesn't remember last night and has lost a day. Pt c/o pain in his right ankle. Awaiting ETOH and I Stat results. Splint in place right ankle. Able to wiggle toes. Toes pink and warm.

## 2011-01-31 NOTE — ED Notes (Signed)
Patient has called a ride to come get him and is fully dressed. Patient still wants some pain medicine. RN Kendal Hymen aware.
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
## 2011-02-01 ENCOUNTER — Ambulatory Visit (INDEPENDENT_AMBULATORY_CARE_PROVIDER_SITE_OTHER): Payer: Self-pay | Admitting: Orthopedic Surgery

## 2011-02-01 ENCOUNTER — Encounter: Payer: Self-pay | Admitting: Orthopedic Surgery

## 2011-02-01 VITALS — BP 120/70 | Ht 68.0 in | Wt 180.0 lb

## 2011-02-01 DIAGNOSIS — S82409A Unspecified fracture of shaft of unspecified fibula, initial encounter for closed fracture: Secondary | ICD-10-CM

## 2011-02-01 MED ORDER — HYDROCODONE-ACETAMINOPHEN 10-325 MG PO TABS: 1.0000 | ORAL_TABLET | ORAL | Status: AC | PRN
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
## 2011-02-01 NOTE — Patient Instructions (Addendum)
Keep  Cast dry   Do not get wet   If it gets wet dry with a hair dryer on low setting and call the office   Do not walk on cast x 4 weeks

## 2011-02-07 ENCOUNTER — Telehealth: Payer: Self-pay | Admitting: Orthopedic Surgery

## 2011-02-07 NOTE — Telephone Encounter (Signed)
Patient called and asked to speak with nurse.  I asked if I can take a message about what he needs to speak with nurse about; states "wants to talk with her about talking with the doctor."  Patient ph# [phone].

## 2011-02-13 NOTE — Telephone Encounter (Signed)
i have spoke with this patient and he is asking for a medication like diazepam to relax him, i advised he does not prescribe meds like that

## 2011-02-27 ENCOUNTER — Telehealth: Payer: Self-pay | Admitting: Orthopedic Surgery

## 2011-02-27 NOTE — Telephone Encounter (Signed)
Patient called to inquire about refill for pain medication.  Per nurse with Dr. Romeo Apple, not due for refill until Thursday, 03/01/11.  Relayed to patient.

## 2011-03-01 ENCOUNTER — Other Ambulatory Visit: Payer: Self-pay | Admitting: *Deleted

## 2011-03-01 MED ORDER — HYDROCODONE-ACETAMINOPHEN 10-325 MG PO TABS: 1.0000 | ORAL_TABLET | Freq: Four times a day (QID) | ORAL | Status: DC | PRN

## 2011-03-05 ENCOUNTER — Encounter: Payer: Self-pay | Admitting: Orthopedic Surgery

## 2011-03-05 ENCOUNTER — Ambulatory Visit (INDEPENDENT_AMBULATORY_CARE_PROVIDER_SITE_OTHER): Payer: Self-pay | Admitting: Orthopedic Surgery

## 2011-03-05 VITALS — Ht 68.0 in | Wt 180.0 lb

## 2011-03-05 DIAGNOSIS — S8263XA Displaced fracture of lateral malleolus of unspecified fibula, initial encounter for closed fracture: Secondary | ICD-10-CM

## 2011-03-05 NOTE — Progress Notes (Signed)
Patient ID: [name], male   DOB: 24-Sep-1970, 41 y.o.   MRN: [id number] Chief Complaint  Patient presents with  . Follow-up    4 week recheck on right ankle.    Cast changed and intact, new cast applied weightbearing  X-ray show fracture healing in appropriate position nondisplaced   Cast off x-ray used in 4 weeks weightbearing as tolerated.
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
## 2011-03-05 NOTE — Patient Instructions (Signed)
Keep  Cast dry   Do not get wet   If it gets wet dry with a hair dryer on low setting and call the office   

## 2011-03-08 ENCOUNTER — Other Ambulatory Visit: Payer: Self-pay | Admitting: Orthopedic Surgery

## 2011-03-08 ENCOUNTER — Telehealth: Payer: Self-pay | Admitting: Orthopedic Surgery

## 2011-03-08 NOTE — Telephone Encounter (Signed)
Fill as appropriate

## 2011-03-08 NOTE — Telephone Encounter (Signed)
Call received from patient at 4:09pm, asking for his pain medication refill, which he said he was told is due today -- states that he spoke with [facility] at 10:30am today and they were to fax a refill request.  Advised patient that we have not received a request. Relayed that requests need to be received by noon on Thursdays in order to be addressed.  He asked for nurse; relayed nurse has gone for the day, and Dr. Romeo Apple is in surgery (today, tomorrow and Monday.)   We then received a call from [place], pharmacist at [facility], who relayed that this patient is harassing him.  We relayed same information.  We have now received the request (2 copies of the request.) Time noted on faxes: 4:10pm, and 4:19pm.

## 2011-03-09 ENCOUNTER — Other Ambulatory Visit: Payer: Self-pay | Admitting: *Deleted

## 2011-03-09 MED ORDER — HYDROCODONE-ACETAMINOPHEN 10-325 MG PO TABS: 1.0000 | ORAL_TABLET | ORAL | Status: DC | PRN

## 2011-03-09 NOTE — Telephone Encounter (Signed)
Medication has been refilled.

## 2011-03-15 ENCOUNTER — Encounter: Payer: Self-pay | Admitting: *Deleted

## 2011-03-16 ENCOUNTER — Other Ambulatory Visit: Payer: Self-pay | Admitting: *Deleted

## 2011-03-16 MED ORDER — HYDROCODONE-ACETAMINOPHEN 10-325 MG PO TABS: 1.0000 | ORAL_TABLET | ORAL | Status: AC | PRN

## 2011-03-28 ENCOUNTER — Emergency Department (HOSPITAL_COMMUNITY)
Admission: EM | Admit: 2011-03-28 | Discharge: 2011-03-28 | Disposition: A | Discharge status: Home / Self Care | Payer: Self-pay | Attending: Emergency Medicine | Admitting: Emergency Medicine

## 2011-03-28 ENCOUNTER — Encounter (HOSPITAL_COMMUNITY): Payer: Self-pay

## 2011-03-28 ENCOUNTER — Emergency Department (HOSPITAL_COMMUNITY): Payer: Self-pay

## 2011-03-28 DIAGNOSIS — M5416 Radiculopathy, lumbar region: Secondary | ICD-10-CM

## 2011-03-28 DIAGNOSIS — Z79899 Other long term (current) drug therapy: Secondary | ICD-10-CM | POA: Insufficient documentation

## 2011-03-28 DIAGNOSIS — M545 Low back pain, unspecified: Secondary | ICD-10-CM | POA: Insufficient documentation

## 2011-03-28 DIAGNOSIS — F17200 Nicotine dependence, unspecified, uncomplicated: Secondary | ICD-10-CM | POA: Insufficient documentation

## 2011-03-28 DIAGNOSIS — IMO0002 Reserved for concepts with insufficient information to code with codable children: Secondary | ICD-10-CM | POA: Insufficient documentation

## 2011-03-28 DIAGNOSIS — K5090 Crohn's disease, unspecified, without complications: Secondary | ICD-10-CM | POA: Insufficient documentation

## 2011-03-28 DIAGNOSIS — F319 Bipolar disorder, unspecified: Secondary | ICD-10-CM | POA: Insufficient documentation

## 2011-03-28 DIAGNOSIS — M79609 Pain in unspecified limb: Secondary | ICD-10-CM | POA: Insufficient documentation

## 2011-03-28 LAB — URINALYSIS, ROUTINE W REFLEX MICROSCOPIC
Bilirubin Urine: NEGATIVE
Glucose, UA: NEGATIVE mg/dL
Hgb urine dipstick: NEGATIVE
Specific Gravity, Urine: 1.025 (ref 1.005–1.030)
Urobilinogen, UA: 0.2 mg/dL (ref 0.0–1.0)
pH: 6.5 (ref 5.0–8.0)

## 2011-03-28 MED ORDER — OXYCODONE-ACETAMINOPHEN 5-325 MG PO TABS
1.0000 | ORAL_TABLET | Freq: Once | ORAL | Status: AC
  Administered 2011-03-28: 1 via ORAL
  Filled 2011-03-28: qty 1

## 2011-03-28 MED ORDER — PREDNISONE 20 MG PO TABS
60.0000 mg | ORAL_TABLET | Freq: Once | ORAL | Status: AC
  Administered 2011-03-28: 60 mg via ORAL
  Filled 2011-03-28: qty 3

## 2011-03-28 MED ORDER — HYDROMORPHONE HCL PF 1 MG/ML IJ SOLN
1.0000 mg | Freq: Once | INTRAMUSCULAR | Status: AC
  Administered 2011-03-28: 1 mg via INTRAMUSCULAR
  Filled 2011-03-28: qty 1

## 2011-03-28 MED ORDER — OXYCODONE-ACETAMINOPHEN 5-325 MG PO TABS: 1.0000 | ORAL_TABLET | ORAL | Status: AC | PRN

## 2011-03-28 MED ORDER — PREDNISONE 20 MG PO TABS: 60.0000 mg | ORAL_TABLET | Freq: Every day | ORAL | Status: DC

## 2011-03-28 NOTE — ED Notes (Signed)
C/o lower back pain. Pt has hx of same.

## 2011-03-28 NOTE — Discharge Instructions (Signed)
Lumbosacral Radiculopathy Lumbosacral radiculopathy is a pinched nerve or nerves in the low back (lumbosacral area). When this happens you may have weakness in your legs and may not be able to stand on your toes. You may have pain going down into your legs. There may be difficulties with walking normally. There are many causes of this problem. Sometimes this may happen from an injury, or simply from arthritis or boney problems. It may also be caused by other illnesses such as diabetes. If there is no improvement after treatment, further studies may be done to find the exact cause. DIAGNOSIS  X-rays may be needed if the problems become long standing. Electromyograms may be done. This study is one in which the working of nerves and muscles is studied. HOME CARE INSTRUCTIONS   Applications of ice packs may be helpful. Ice can be used in a plastic bag with a towel around it to prevent frostbite to skin. This may be used every 2 hours for 20 to 30 minutes, or as needed, while awake, or as directed by your caregiver.   Only take over-the-counter or prescription medicines for pain, discomfort, or fever as directed by your caregiver.   If physical therapy was prescribed, follow your caregiver's directions.  SEEK IMMEDIATE MEDICAL CARE IF:   You have pain not controlled with medications.   You seem to be getting worse rather than better.   You develop increasing weakness in your legs.   You develop loss of bowel or bladder control.   You have difficulty with walking or balance, or develop clumsiness in the use of your legs.   You have a fever.  MAKE SURE YOU:   Understand these instructions.   Will watch your condition.   Will get help right away if you are not doing well or get worse.  Document Released: 12/25/2004 Document Revised: 12/14/2010 Document Reviewed: 08/15/2007 [facility] Patient Information 2012 [place], [place].   Please take the Percocet prescribed in place of your hydrocodone  which may give better pain relief.  Complete the entire course of prednisone.  Do not drive within 4 hours of taking Percocet as this will make you sleepy.  Call Dr. Chaney Malling for further management of your pain if it persists or worsens in any way.  Your x-rays are normal tonight as is your urinalysis.   Avoid lifting,  Bending,  Twisting or any other activity that worsens your pain over the next week.  Apply an  icepack  to your lower back for 10-15 minutes every 2 hours for the next 2 days.  You should get rechecked if your symptoms are not better over the next 5 days,  Or you develop increased pain,  Weakness in your leg(s) or loss of bladder or bowel function - these are symptoms of a worse injury.

## 2011-03-29 NOTE — ED Provider Notes (Signed)
History     CSN: [id number]  Arrival date & time 03/28/11  2012   First MD Initiated Contact with Patient 03/28/11 2047      Chief Complaint  Patient presents with  . Back Pain    (Consider location/radiation/quality/duration/timing/severity/associated sxs/prior treatment) HPI Comments: Patient reports a known history of intermittent low back pain.  He is recently recovering from a right lower extremity tib-fib fracture sustained when he fell off of a ladder 2 months ago.  He reports slowly progressive worsening back pain since his injury.  He is under the care of Dr. Romeo Apple for his lower extremity fracture, and has recently graduated graduated from nonweightbearing to weight bearing in a splint.  He's been trying to be more active in order to improve strength and muscle tone, but is having increased low back pain particularly past several days since he has been trying to work in his garden.  He denies any loss of control of bowels or bladder.  He does state he had a fall yesterday do to a sudden sharp jabbing pain in his back, but also fell to his knees were weak at that time.  He has not had any falls or sensation of lower extremity weakness since that event.  Patient is a 41 y.o. male presenting with back pain. The history is provided by the patient.  Back Pain  This is a chronic problem. The problem occurs constantly. The problem has been gradually worsening. The pain is present in the lumbar spine. The quality of the pain is described as shooting, burning and stabbing. The pain radiates to the right thigh and left thigh. The pain is at a severity of 8/10. The pain is moderate. The symptoms are aggravated by bending, twisting and certain positions. The pain is worse during the day. Associated symptoms include leg pain. Pertinent negatives include no chest pain, no fever, no numbness, no headaches, no abdominal pain, no bowel incontinence, no perianal numbness, no bladder incontinence, no  dysuria, no paresthesias, no paresis, no tingling and no weakness. Associated symptoms comments: He has noticed increased urinary frequency, denies dysuria, hematuria.. He has tried NSAIDs and analgesics for the symptoms. The treatment provided no relief.    Past Medical History  Diagnosis Date  . Other, mixed, or unspecified nondependent drug abuse, unspecified   . Personal history of unspecified digestive disease   . Abdominal pain, unspecified site   . Bipolar disorder, unspecified   . Backache, unspecified   . Tobacco use disorder   . Crohn's     Past Surgical History  Procedure Date  . Right inguinal hernia repair     Family History  Problem Relation Age of Onset  . Lung cancer Father   . Cancer Father     lung  . Heart disease Mother     History  Substance Use Topics  . Smoking status: Current Everyday Smoker    Types: Cigarettes  . Smokeless tobacco: Not on file  . Alcohol Use: Yes     pt states he quit 5 months ago      Review of Systems  Constitutional: Negative for fever.  HENT: Negative for sore throat and neck pain.   Eyes: Negative.   Respiratory: Negative for chest tightness.   Cardiovascular: Negative for chest pain.  Gastrointestinal: Negative for abdominal pain and bowel incontinence.  Genitourinary: Negative.  Negative for bladder incontinence and dysuria.  Musculoskeletal: Positive for back pain, arthralgias and gait problem. Negative for joint swelling.  Skin:  Negative.  Negative for rash and wound.  Neurological: Negative for dizziness, tingling, weakness, light-headedness, numbness, headaches and paresthesias.  Hematological: Negative.   Psychiatric/Behavioral: Negative.     Allergies  Fluoxetine hcl and Ketorolac tromethamine  Home Medications   Current Outpatient Rx  Name Route Sig Dispense Refill  . ALBUTEROL SULFATE HFA 108 (90 BASE) MCG/ACT IN AERS Inhalation Inhale 2 puffs into the lungs every 6 (six) hours as needed. for  shortness of breath    . HYDROCODONE-ACETAMINOPHEN 10-325 MG PO TABS Oral Take 0.5-1 tablets by mouth every 6 (six) hours as needed. For pain    . OMEPRAZOLE 20 MG PO CPDR Oral Take 20 mg by mouth daily as needed. For indigestion     . ZANAFLEX 4 MG PO TABS  TAKE 1 TABLET EVERY 12   HOURS AS NEEDED FORMUSCLE SPASMS. 60 each 1  . OXYCODONE-ACETAMINOPHEN 5-325 MG PO TABS Oral Take 1 tablet by mouth every 4 (four) hours as needed for pain. 15 tablet 0  . PREDNISONE 20 MG PO TABS Oral Take 3 tablets (60 mg total) by mouth daily. 15 tablet 0    BP 124/69  Pulse 76  Temp(Src) 98.6 F (37 C) (Oral)  Resp 16  Ht 6' (1.829 m)  Wt 180 lb (81.647 kg)  BMI 24.41 kg/m2  SpO2 98%  Physical Exam  Nursing note and vitals reviewed. Constitutional: He is oriented to person, place, and time. He appears well-developed and well-nourished.  HENT:  Head: Normocephalic.  Eyes: Conjunctivae are normal.  Neck: Normal range of motion. Neck supple.  Cardiovascular: Regular rhythm and intact distal pulses.        Pedal pulses normal.  Pulmonary/Chest: Effort normal. He has no wheezes.  Abdominal: Soft. Bowel sounds are normal. He exhibits no distension and no mass.  Musculoskeletal: Normal range of motion. He exhibits tenderness. He exhibits no edema.       Lumbar back: He exhibits tenderness and bony tenderness. He exhibits no swelling, no edema, no deformity and no spasm.  Neurological: He is alert and oriented to person, place, and time. He has normal strength. He displays no atrophy and no tremor. No cranial nerve deficit or sensory deficit. Gait normal.  Reflex Scores:      Patellar reflexes are 2+ on the right side and 2+ on the left side.      Achilles reflexes are 2+ on the right side and 2+ on the left side.      No strength deficit noted in hip and knee flexor and extensor muscle groups.  Ankle flexion and extension intact.  Skin: Skin is warm and dry.  Psychiatric: He has a normal mood and affect.     ED Course  Procedures (including critical care time)  Labs Reviewed  URINALYSIS, ROUTINE W REFLEX MICROSCOPIC - Abnormal; Notable for the following:    APPearance HAZY (*)    All other components within normal limits   Dg Lumbar Spine Complete  03/28/2011  *RADIOLOGY REPORT*  Clinical Data: Low back pain, fell 1 month ago  LUMBAR SPINE - COMPLETE 4+ VIEW  Comparison: None  Findings: Five non-rib bearing lumbar vertebrae. Bones appear slightly demineralized. Vertebral body and disc space heights maintained. No acute fracture, subluxation or bone destruction. No spondylolysis. SI joints symmetric.  IMPRESSION: No acute bony abnormalities.  Original Report Authenticated By: Lollie Marrow, M.D.     1. Lumbar radiculopathy     Patient was given hydromorphone 1 mg IM injection, after her  oxycodone one tablet and prednisone 60 mg by mouth did not relieve his symptoms.  He was improved at time of discharge, ambulatory without any distress.  MDM  Prescription for oxycodone in place of his hydrocodone.  Prednisone 60 mg by a pulse dose.  Discussed no lifting bending or twisting for the next 5 days, heat therapy.  Recheck by Dr. Romeo Apple if not improved over the next 5 days. No neuro deficit on exam or by history to suggest emergent or surgical presentation.           Candis Musa, PA 03/29/11 [phone]

## 2011-03-31 ENCOUNTER — Emergency Department (HOSPITAL_COMMUNITY)
Admission: EM | Admit: 2011-03-31 | Discharge: 2011-03-31 | Disposition: A | Discharge status: Home / Self Care | Payer: Self-pay | Attending: Emergency Medicine | Admitting: Emergency Medicine

## 2011-03-31 ENCOUNTER — Encounter (HOSPITAL_COMMUNITY): Payer: Self-pay | Admitting: Emergency Medicine

## 2011-03-31 DIAGNOSIS — M5416 Radiculopathy, lumbar region: Secondary | ICD-10-CM

## 2011-03-31 DIAGNOSIS — R269 Unspecified abnormalities of gait and mobility: Secondary | ICD-10-CM | POA: Insufficient documentation

## 2011-03-31 DIAGNOSIS — K5090 Crohn's disease, unspecified, without complications: Secondary | ICD-10-CM | POA: Insufficient documentation

## 2011-03-31 DIAGNOSIS — M2550 Pain in unspecified joint: Secondary | ICD-10-CM | POA: Insufficient documentation

## 2011-03-31 DIAGNOSIS — F17200 Nicotine dependence, unspecified, uncomplicated: Secondary | ICD-10-CM | POA: Insufficient documentation

## 2011-03-31 DIAGNOSIS — F319 Bipolar disorder, unspecified: Secondary | ICD-10-CM | POA: Insufficient documentation

## 2011-03-31 DIAGNOSIS — IMO0002 Reserved for concepts with insufficient information to code with codable children: Secondary | ICD-10-CM | POA: Insufficient documentation

## 2011-03-31 MED ORDER — HYDROMORPHONE HCL PF 1 MG/ML IJ SOLN
1.0000 mg | Freq: Once | INTRAMUSCULAR | Status: AC
  Administered 2011-03-31: 1 mg via INTRAMUSCULAR
  Filled 2011-03-31: qty 1

## 2011-03-31 MED ORDER — TRAMADOL HCL 50 MG PO TABS: 50.0000 mg | ORAL_TABLET | Freq: Four times a day (QID) | ORAL | Status: AC | PRN

## 2011-03-31 MED ORDER — ONDANSETRON HCL 4 MG PO TABS: 8.0000 mg | ORAL_TABLET | Freq: Four times a day (QID) | ORAL | Status: AC

## 2011-03-31 NOTE — ED Notes (Signed)
Pt states "would like to see [name] as I need a refill on my pain medication, that's the main reason I came here." EDP notified of pt's request.

## 2011-03-31 NOTE — ED Notes (Signed)
Pt states got out of the bathtub last night and twisted to the side as he did felt his back pop. Pt has a short cast on his rt ankle. Toes pink,warm to touch. Medicated per orders.

## 2011-03-31 NOTE — Discharge Instructions (Signed)
Lumbosacral Radiculopathy Lumbosacral radiculopathy is a pinched nerve or nerves in the low back (lumbosacral area). When this happens you may have weakness in your legs and may not be able to stand on your toes. You may have pain going down into your legs. There may be difficulties with walking normally. There are many causes of this problem. Sometimes this may happen from an injury, or simply from arthritis or boney problems. It may also be caused by other illnesses such as diabetes. If there is no improvement after treatment, further studies may be done to find the exact cause. DIAGNOSIS  X-rays may be needed if the problems become long standing. Electromyograms may be done. This study is one in which the working of nerves and muscles is studied. HOME CARE INSTRUCTIONS   Applications of ice packs may be helpful. Ice can be used in a plastic bag with a towel around it to prevent frostbite to skin. This may be used every 2 hours for 20 to 30 minutes, or as needed, while awake, or as directed by your caregiver.   Only take over-the-counter or prescription medicines for pain, discomfort, or fever as directed by your caregiver.   If physical therapy was prescribed, follow your caregiver's directions.  SEEK IMMEDIATE MEDICAL CARE IF:   You have pain not controlled with medications.   You seem to be getting worse rather than better.   You develop increasing weakness in your legs.   You develop loss of bowel or bladder control.   You have difficulty with walking or balance, or develop clumsiness in the use of your legs.   You have a fever.  MAKE SURE YOU:   Understand these instructions.   Will watch your condition.   Will get help right away if you are not doing well or get worse.  Document Released: 12/25/2004 Document Revised: 12/14/2010 Document Reviewed: 08/15/2007 [facility] Patient Information 2012 [place], [place].   Continue to take you home medicines and followup with Dr Romeo Apple  as scheduled in 3 days.

## 2011-03-31 NOTE — ED Notes (Signed)
Pt c/o lower back pain and was seen for the same a couple days ago.

## 2011-04-01 NOTE — ED Provider Notes (Signed)
Medical screening examination/treatment/procedure(s) were performed by non-physician practitioner and as supervising physician I was immediately available for consultation/collaboration.  Flint Melter, MD 04/01/11 [phone]

## 2011-04-02 NOTE — ED Provider Notes (Signed)
History     CSN: [id number]  Arrival date & time 03/31/11  1556     Chief Complaint  Patient presents with  . Back Pain    (Consider location/radiation/quality/duration/timing/severity/associated sxs/prior treatment) HPI Comments: Patient reports a known history of intermittent low back pain.  He is recently recovering from a right lower extremity tib-fib fracture sustained when he fell off of a ladder 2 months ago.  He reports slowly progressive worsening back pain since his injury.  He is under the care of Dr. Romeo Apple for his lower extremity fracture, and has recently graduated graduated from nonweightbearing to weight bearing in a splint.   He was seen here several days ago,  And was placed on prednisone and oxycodone which was starting to relieve his pain,  But then he "jarred" his back getting out of the tub last night and now pain is worse.  He denies any loss of control of bowels or bladder.   Patient is a 41 y.o. male presenting with back pain. The history is provided by the patient.  Back Pain  This is a chronic problem. The problem occurs constantly. The problem has been gradually worsening. The pain is present in the lumbar spine. The quality of the pain is described as shooting, burning and stabbing. The pain radiates to the right thigh and left thigh. The pain is at a severity of 9/10. The pain is severe. The symptoms are aggravated by bending, twisting and certain positions. The pain is worse during the day. Associated symptoms include leg pain. Pertinent negatives include no chest pain, no fever, no numbness, no headaches, no abdominal pain, no bowel incontinence, no perianal numbness, no bladder incontinence, no dysuria, no paresthesias, no paresis, no tingling and no weakness. Associated symptoms comments: He has noticed increased urinary frequency, denies dysuria, hematuria.. He has tried NSAIDs and analgesics for the symptoms. The treatment provided no relief.    Past Medical  History  Diagnosis Date  . Other, mixed, or unspecified nondependent drug abuse, unspecified   . Personal history of unspecified digestive disease   . Abdominal pain, unspecified site   . Bipolar disorder, unspecified   . Backache, unspecified   . Tobacco use disorder   . Crohn's     Past Surgical History  Procedure Date  . Right inguinal hernia repair     Family History  Problem Relation Age of Onset  . Lung cancer Father   . Cancer Father     lung  . Heart disease Mother     History  Substance Use Topics  . Smoking status: Current Everyday Smoker    Types: Cigarettes  . Smokeless tobacco: Not on file  . Alcohol Use: Yes     pt states he quit 5 months ago      Review of Systems  Constitutional: Negative for fever.  HENT: Negative for sore throat and neck pain.   Eyes: Negative.   Respiratory: Negative for chest tightness.   Cardiovascular: Negative for chest pain.  Gastrointestinal: Negative for abdominal pain and bowel incontinence.  Genitourinary: Negative.  Negative for bladder incontinence and dysuria.  Musculoskeletal: Positive for back pain, arthralgias and gait problem. Negative for joint swelling.  Skin: Negative.  Negative for rash and wound.  Neurological: Negative for dizziness, tingling, weakness, light-headedness, numbness, headaches and paresthesias.  Hematological: Negative.   Psychiatric/Behavioral: Negative.     Allergies  Fluoxetine hcl and Ketorolac tromethamine  Home Medications   Current Outpatient Rx  Name Route Sig Dispense  Refill  . ALBUTEROL SULFATE HFA 108 (90 BASE) MCG/ACT IN AERS Inhalation Inhale 2 puffs into the lungs every 6 (six) hours as needed. for shortness of breath    . HYDROCODONE-ACETAMINOPHEN 10-325 MG PO TABS Oral Take 0.5-1 tablets by mouth every 6 (six) hours as needed. For pain    . OMEPRAZOLE 20 MG PO CPDR Oral Take 20 mg by mouth daily as needed. For indigestion     . ONDANSETRON HCL 4 MG PO TABS Oral Take 2  tablets (8 mg total) by mouth every 6 (six) hours. 12 tablet 0  . OXYCODONE-ACETAMINOPHEN 5-325 MG PO TABS Oral Take 1 tablet by mouth every 4 (four) hours as needed for pain. 15 tablet 0  . PREDNISONE 20 MG PO TABS Oral Take 3 tablets (60 mg total) by mouth daily. 15 tablet 0  . TRAMADOL HCL 50 MG PO TABS Oral Take 1 tablet (50 mg total) by mouth every 6 (six) hours as needed for pain. 15 tablet 0  . ZANAFLEX 4 MG PO TABS  TAKE 1 TABLET EVERY 12   HOURS AS NEEDED FORMUSCLE SPASMS. 60 each 1    BP 140/70  Temp(Src) 98 F (36.7 C) (Oral)  Resp 18  Ht 6\' 1"  (1.854 m)  Wt 173 lb (78.472 kg)  BMI 22.82 kg/m2  SpO2 99%  Physical Exam  Nursing note and vitals reviewed. Constitutional: He is oriented to person, place, and time. He appears well-developed and well-nourished.  HENT:  Head: Normocephalic.  Eyes: Conjunctivae are normal.  Neck: Normal range of motion. Neck supple.  Cardiovascular: Regular rhythm and intact distal pulses.        Pedal pulses normal.  Pulmonary/Chest: Effort normal. He has no wheezes.  Abdominal: Soft. Bowel sounds are normal. He exhibits no distension and no mass.  Musculoskeletal: Normal range of motion. He exhibits tenderness. He exhibits no edema.       Lumbar back: He exhibits tenderness and bony tenderness. He exhibits no swelling, no edema, no deformity and no spasm.  Neurological: He is alert and oriented to person, place, and time. He has normal strength. He displays no atrophy and no tremor. No cranial nerve deficit or sensory deficit. Gait normal.  Reflex Scores:      Patellar reflexes are 2+ on the right side and 2+ on the left side.      Achilles reflexes are 2+ on the right side and 2+ on the left side.      No strength deficit noted in hip and knee flexor and extensor muscle groups.  Ankle flexion and extension intact.  Skin: Skin is warm and dry.  Psychiatric: He has a normal mood and affect.    ED Course  Procedures  (including critical  care time)  Labs Reviewed - No data to display No results found.   1. Lumbar radiculopathy     Patient was given hydromorphone 1 mg IM injection while here. MDM  Patient prescribed tramadol and zofran (pt reports tramadol makes nauseated.  He has an appt with Dr Romeo Apple next week.  Discussed that I cannot refill his oxycodone for his chronic pain. No neuro deficit on exam or by history to suggest emergent or surgical presentation.           Candis Musa, PA 03/29/11 0113  Candis Musa, PA 04/02/11 1259

## 2011-04-04 ENCOUNTER — Ambulatory Visit (INDEPENDENT_AMBULATORY_CARE_PROVIDER_SITE_OTHER): Payer: Self-pay | Admitting: Orthopedic Surgery

## 2011-04-04 ENCOUNTER — Encounter: Payer: Self-pay | Admitting: Orthopedic Surgery

## 2011-04-04 VITALS — BP 140/60 | Ht 73.0 in | Wt 173.0 lb

## 2011-04-04 DIAGNOSIS — S82843A Displaced bimalleolar fracture of unspecified lower leg, initial encounter for closed fracture: Secondary | ICD-10-CM

## 2011-04-04 MED ORDER — HYDROCODONE-ACETAMINOPHEN 7.5-325 MG PO TABS: 1.0000 | ORAL_TABLET | ORAL | Status: AC | PRN

## 2011-04-04 MED ORDER — TIZANIDINE HCL 4 MG PO TABS: 4.0000 mg | ORAL_TABLET | Freq: Three times a day (TID) | ORAL | Status: DC | PRN

## 2011-04-04 NOTE — Progress Notes (Signed)
Patient ID: [name], male   DOB: 01/31/1970, 41 y.o.   MRN: [id number] Chief Complaint  Patient presents with  . Follow-up    4 week recheck right ankle and xray    The patient had a bimalleolar fracture nondisplaced was treated with a short leg cast over a period of January 22 until now so we are about 8 or 9 weeks out.  X-rays are taken today shows that the fractures have healed.  The clinical exam reveals a neutral foot position some stiffness but no pain at the fracture sites  The patient is allowed to be weightbearing as tolerated  Separately identifiable x-ray report 3 views of the involved RIGHT ankle  RIGHT ankle fracture  The fractures are nondisplaced and the fractures have healed  I can see a little fracture line visible on the lateral x-ray but this is inconsequential  The clinical exam should be correlated  Healed fracture

## 2011-04-04 NOTE — Patient Instructions (Signed)
Weightbearing as tolerated!

## 2011-04-06 NOTE — ED Provider Notes (Signed)
Medical screening examination/treatment/procedure(s) were performed by non-physician practitioner and as supervising physician I was immediately available for consultation/collaboration.   Shelda Jakes, MD 04/06/11 [phone]

## 2011-04-10 ENCOUNTER — Other Ambulatory Visit: Payer: Self-pay | Admitting: Orthopedic Surgery

## 2011-04-15 ENCOUNTER — Emergency Department (HOSPITAL_COMMUNITY)
Admission: EM | Admit: 2011-04-15 | Discharge: 2011-04-15 | Disposition: A | Discharge status: Home / Self Care | Payer: Self-pay | Attending: Emergency Medicine | Admitting: Emergency Medicine

## 2011-04-15 ENCOUNTER — Encounter (HOSPITAL_COMMUNITY): Payer: Self-pay

## 2011-04-15 DIAGNOSIS — Z886 Allergy status to analgesic agent status: Secondary | ICD-10-CM | POA: Insufficient documentation

## 2011-04-15 DIAGNOSIS — F17200 Nicotine dependence, unspecified, uncomplicated: Secondary | ICD-10-CM | POA: Insufficient documentation

## 2011-04-15 DIAGNOSIS — M545 Low back pain, unspecified: Secondary | ICD-10-CM | POA: Insufficient documentation

## 2011-04-15 DIAGNOSIS — F3189 Other bipolar disorder: Secondary | ICD-10-CM | POA: Insufficient documentation

## 2011-04-15 DIAGNOSIS — M5416 Radiculopathy, lumbar region: Secondary | ICD-10-CM

## 2011-04-15 HISTORY — DX: Other chronic pain: G89.29

## 2011-04-15 HISTORY — DX: Dorsalgia, unspecified: M54.9

## 2011-04-15 MED ORDER — HYDROMORPHONE HCL PF 1 MG/ML IJ SOLN
1.0000 mg | Freq: Once | INTRAMUSCULAR | Status: AC
  Administered 2011-04-15: 1 mg via INTRAMUSCULAR
  Filled 2011-04-15: qty 1

## 2011-04-15 MED ORDER — DIAZEPAM 5 MG PO TABS
5.0000 mg | ORAL_TABLET | Freq: Once | ORAL | Status: AC
  Administered 2011-04-15: 5 mg via ORAL
  Filled 2011-04-15: qty 1

## 2011-04-15 MED ORDER — MELOXICAM 7.5 MG PO TABS: ORAL_TABLET | ORAL | Status: DC

## 2011-04-15 MED ORDER — OXYCODONE-ACETAMINOPHEN 5-325 MG PO TABS
1.0000 | ORAL_TABLET | Freq: Once | ORAL | Status: AC
  Administered 2011-04-15: 1 via ORAL
  Filled 2011-04-15: qty 1

## 2011-04-15 MED ORDER — PROMETHAZINE HCL 25 MG PO TABS: 25.0000 mg | ORAL_TABLET | Freq: Four times a day (QID) | ORAL | Status: DC | PRN

## 2011-04-15 NOTE — Discharge Instructions (Signed)

## 2011-04-15 NOTE — ED Provider Notes (Signed)
Medical screening examination/treatment/procedure(s) were performed by non-physician practitioner and as supervising physician I was immediately available for consultation/collaboration.   Jenavee Laguardia L Shevette Bess, MD 04/15/11 1538 

## 2011-04-15 NOTE — ED Notes (Signed)
Back pain that started this am, felt pop getting into car, h/o chronic back pain

## 2011-04-15 NOTE — ED Provider Notes (Signed)
History     CSN: [id number]  Arrival date & time 04/15/11  4098   First MD Initiated Contact with Patient 04/15/11 0845      Chief Complaint  Patient presents with  . Back Pain    (Consider location/radiation/quality/duration/timing/severity/associated sxs/prior treatment) HPI Comments: Patient with a history of chronic low back pain states the pain became worse this morning after twisting his back while driving. Patient has been seen in this emergency department several times for similar complaints. He is also recently been under the care of an orthopedic physician for a previously sustained ankle fracture but states the physician does not manage his back pain. He describes the pain as sharp and persistent and intermittently radiates into his right leg down to his foot. He denies incontinence of urine or feces, numbness, weakness or dysuria.  Patient is a 41 y.o. male presenting with back pain. The history is provided by the patient.  Back Pain  This is a chronic problem. The current episode started 3 to 5 hours ago. The problem occurs constantly. The problem has not changed since onset.The pain is associated with twisting. The pain is present in the lumbar spine and sacro-iliac joint. The quality of the pain is described as aching and shooting. The pain radiates to the right thigh and right foot. The pain is moderate. The symptoms are aggravated by bending, twisting and certain positions. The pain is the same all the time. Associated symptoms include leg pain. Pertinent negatives include no chest pain, no fever, no numbness, no abdominal pain, no abdominal swelling, no bowel incontinence, no perianal numbness, no bladder incontinence, no dysuria, no pelvic pain, no paresthesias, no paresis, no tingling and no weakness. He has tried nothing for the symptoms. The treatment provided no relief.    Past Medical History  Diagnosis Date  . Other, mixed, or unspecified nondependent drug abuse,  unspecified   . Personal history of unspecified digestive disease   . Abdominal pain, unspecified site   . Bipolar disorder, unspecified   . Backache, unspecified   . Tobacco use disorder   . Crohn's   . Chronic back pain     Past Surgical History  Procedure Date  . Right inguinal hernia repair     Family History  Problem Relation Age of Onset  . Lung cancer Father   . Cancer Father     lung  . Heart disease Mother     History  Substance Use Topics  . Smoking status: Current Everyday Smoker    Types: Cigarettes  . Smokeless tobacco: Not on file  . Alcohol Use: Yes     daily      Review of Systems  Constitutional: Negative for fever, chills and fatigue.  HENT: Negative for sore throat, trouble swallowing, neck pain and neck stiffness.   Respiratory: Negative for cough, shortness of breath and wheezing.   Cardiovascular: Negative for chest pain and palpitations.  Gastrointestinal: Negative for nausea, vomiting, abdominal pain, blood in stool and bowel incontinence.  Genitourinary: Negative for bladder incontinence, dysuria, hematuria, flank pain, scrotal swelling, difficulty urinating, testicular pain and pelvic pain.  Musculoskeletal: Positive for back pain. Negative for myalgias, joint swelling, arthralgias and gait problem.  Skin: Negative for rash.  Neurological: Negative for dizziness, tingling, weakness, numbness and paresthesias.  Hematological: Does not bruise/bleed easily.  All other systems reviewed and are negative.    Allergies  Fluoxetine hcl; Ketorolac tromethamine; and Ultram  Home Medications   Current Outpatient Rx  Name  Route Sig Dispense Refill  . ALBUTEROL SULFATE HFA 108 (90 BASE) MCG/ACT IN AERS Inhalation Inhale 2 puffs into the lungs every 6 (six) hours as needed. for shortness of breath    . HYDROCODONE-ACETAMINOPHEN 7.5-325 MG PO TABS Oral Take 1 tablet by mouth every 4 (four) hours as needed for pain. 30 tablet 0  . OMEPRAZOLE 20 MG  PO CPDR Oral Take 20 mg by mouth daily as needed. For indigestion     . PREDNISONE 20 MG PO TABS Oral Take 3 tablets (60 mg total) by mouth daily. 15 tablet 0  . TIZANIDINE HCL 4 MG PO TABS Oral Take 1 tablet (4 mg total) by mouth every 8 (eight) hours as needed. 60 tablet 0  . ZANAFLEX 4 MG PO TABS  TAKE 1 TABLET EVERY 12   HOURS AS NEEDED FORMUSCLE SPASMS. 60 each 1    BP 107/58  Pulse 78  Temp(Src) 98.2 F (36.8 C) (Oral)  Resp 18  Ht 6\' 1"  (1.854 m)  Wt 175 lb (79.379 kg)  BMI 23.09 kg/m2  SpO2 100%  Physical Exam  Nursing note and vitals reviewed. Constitutional: He is oriented to person, place, and time. He appears well-developed and well-nourished. No distress.  HENT:  Head: Atraumatic.  Cardiovascular: Normal rate, regular rhythm and normal heart sounds.   No murmur heard. Pulmonary/Chest: Effort normal and breath sounds normal.  Musculoskeletal: He exhibits tenderness. He exhibits no edema.       Lumbar back: He exhibits tenderness and bony tenderness. He exhibits normal range of motion, no swelling, no edema, no deformity, no laceration and normal pulse.       Back:       Tenderness to palpation of the right lumbar paraspinal muscles and right SI joint space  Neurological: He is alert and oriented to person, place, and time. No cranial nerve deficit or sensory deficit. He exhibits normal muscle tone. Coordination and gait normal.  Reflex Scores:      Patellar reflexes are 2+ on the right side and 2+ on the left side.      Achilles reflexes are 2+ on the right side and 2+ on the left side. Skin: Skin is warm and dry.    ED Course  Procedures (including critical care time)   Patient had a ls spine film on 03/28/11 showing no acute bony abnml.   MDM  Previous ED charts and imaging were reviewed by me  Patient has tenderness to palpation of the right lumbar paraspinal muscles and right SI joint space. No focal neuro deficits on exam. He ambulates with a slow but  steady gait. Pain is reproduced with straight-leg raise on the right. Patient has been seen here multiple times for similar symptoms. He has been under the care of Dr. Romeo Apple for a previously sustained ankle fracture at the patient states that Dr. Romeo Apple will not see him for his chronic back pain. I have reviewed the patient in the [place] narcotics database patient received 30 hydrocodone from Dr. Romeo Apple on 04/04/2011.  Patient has received an IM injection of Dilaudid today and I will prescribe muscle relaxer and give him a referral for [facility].   Patient / Family / Caregiver understand and agree with initial ED impression and plan with expectations set for ED visit. Pt stable in ED with no significant deterioration in condition. Pt feels improved after observation and/or treatment in ED.    Kyjuan Gause L. [place], [place] 04/15/11 [phone]

## 2011-04-16 ENCOUNTER — Encounter (HOSPITAL_COMMUNITY): Payer: Self-pay | Admitting: *Deleted

## 2011-04-16 DIAGNOSIS — M545 Low back pain, unspecified: Secondary | ICD-10-CM | POA: Insufficient documentation

## 2011-04-16 DIAGNOSIS — F319 Bipolar disorder, unspecified: Secondary | ICD-10-CM | POA: Insufficient documentation

## 2011-04-16 DIAGNOSIS — Z8249 Family history of ischemic heart disease and other diseases of the circulatory system: Secondary | ICD-10-CM | POA: Insufficient documentation

## 2011-04-16 DIAGNOSIS — Z8719 Personal history of other diseases of the digestive system: Secondary | ICD-10-CM | POA: Insufficient documentation

## 2011-04-16 DIAGNOSIS — F1910 Other psychoactive substance abuse, uncomplicated: Secondary | ICD-10-CM | POA: Insufficient documentation

## 2011-04-16 DIAGNOSIS — F17200 Nicotine dependence, unspecified, uncomplicated: Secondary | ICD-10-CM | POA: Insufficient documentation

## 2011-04-16 DIAGNOSIS — Z801 Family history of malignant neoplasm of trachea, bronchus and lung: Secondary | ICD-10-CM | POA: Insufficient documentation

## 2011-04-16 NOTE — ED Notes (Signed)
Pt reports chronic back pain, reports he worked today and now pain is worse

## 2011-04-17 ENCOUNTER — Emergency Department (HOSPITAL_COMMUNITY)
Admission: EM | Admit: 2011-04-17 | Discharge: 2011-04-17 | Disposition: A | Discharge status: Home / Self Care | Payer: Self-pay | Attending: Emergency Medicine | Admitting: Emergency Medicine

## 2011-04-17 DIAGNOSIS — M549 Dorsalgia, unspecified: Secondary | ICD-10-CM

## 2011-04-17 MED ORDER — OXYCODONE-ACETAMINOPHEN 5-325 MG PO TABS: 1.0000 | ORAL_TABLET | ORAL | Status: AC | PRN

## 2011-04-17 MED ORDER — OXYCODONE-ACETAMINOPHEN 5-325 MG PO TABS
1.0000 | ORAL_TABLET | Freq: Once | ORAL | Status: AC
  Administered 2011-04-17: 1 via ORAL
  Filled 2011-04-17: qty 1

## 2011-04-17 NOTE — ED Provider Notes (Signed)
History     CSN: [id number]  Arrival date & time 04/16/11  2310   First MD Initiated Contact with Patient 04/17/11 [phone]      Chief Complaint  Patient presents with  . Back Pain    (Consider location/radiation/quality/duration/timing/severity/associated sxs/prior treatment) HPI Comments: [name] is a 41 y.o. Male has felt a knot on his lower back for several days. He is out of his pain medicine. He states he tried to followup with the doctors that emergency department providers have given him. He's been unable to do that yet. He denies change in bowel or urinary habits, perineal anesthesia, weakness, dizziness, nausea, vomiting, or fever.  The history is provided by the patient.    Past Medical History  Diagnosis Date  . Other, mixed, or unspecified nondependent drug abuse, unspecified   . Personal history of unspecified digestive disease   . Abdominal pain, unspecified site   . Bipolar disorder, unspecified   . Backache, unspecified   . Tobacco use disorder   . Crohn's   . Chronic back pain     Past Surgical History  Procedure Date  . Right inguinal hernia repair     Family History  Problem Relation Age of Onset  . Lung cancer Father   . Cancer Father     lung  . Heart disease Mother     History  Substance Use Topics  . Smoking status: Current Everyday Smoker    Types: Cigarettes  . Smokeless tobacco: Not on file  . Alcohol Use: Yes     daily      Review of Systems  All other systems reviewed and are negative.    Allergies  Fluoxetine hcl; Ketorolac tromethamine; and Ultram  Home Medications   Current Outpatient Rx  Name Route Sig Dispense Refill  . ALBUTEROL SULFATE HFA 108 (90 BASE) MCG/ACT IN AERS Inhalation Inhale 2 puffs into the lungs every 6 (six) hours as needed. for shortness of breath    . MELOXICAM 7.5 MG PO TABS  Take one tablet po BID .  TAke with food 20 tablet 0  . OMEPRAZOLE 20 MG PO CPDR Oral Take 20 mg by mouth daily as  needed. For indigestion     . OXYCODONE-ACETAMINOPHEN 5-325 MG PO TABS Oral Take 1 tablet by mouth every 4 (four) hours as needed for pain. 12 tablet 0  . PREDNISONE 20 MG PO TABS Oral Take 3 tablets (60 mg total) by mouth daily. 15 tablet 0  . PROMETHAZINE HCL 25 MG PO TABS Oral Take 1 tablet (25 mg total) by mouth every 6 (six) hours as needed for nausea. 10 tablet 0  . TIZANIDINE HCL 4 MG PO TABS Oral Take 1 tablet (4 mg total) by mouth every 8 (eight) hours as needed. 60 tablet 0  . ZANAFLEX 4 MG PO TABS  TAKE 1 TABLET EVERY 12   HOURS AS NEEDED FORMUSCLE SPASMS. 60 each 1    BP 130/66  Pulse 72  Temp(Src) 98.2 F (36.8 C) (Oral)  Resp 18  Ht 6\' 1"  (1.854 m)  Wt 175 lb (79.379 kg)  BMI 23.09 kg/m2  SpO2 98%  Physical Exam  Nursing note and vitals reviewed. Constitutional: He is oriented to person, place, and time. He appears well-developed and well-nourished.  HENT:  Head: Normocephalic and atraumatic.  Right Ear: External ear normal.  Left Ear: External ear normal.  Eyes: Conjunctivae and EOM are normal. Pupils are equal, round, and reactive to light.  Neck: Normal range of motion and phonation normal. Neck supple.  Cardiovascular: Normal rate, regular rhythm, normal heart sounds and intact distal pulses.   Pulmonary/Chest: Effort normal and breath sounds normal. He exhibits no bony tenderness.  Abdominal: Soft. Normal appearance. There is no tenderness.  Musculoskeletal: Normal range of motion.       He has mild low midline lumbar tenderness. There is no associated mass, swelling, or deformity.  Neurological: He is alert and oriented to person, place, and time. He has normal strength. No cranial nerve deficit or sensory deficit. He exhibits normal muscle tone. Coordination normal.  Skin: Skin is warm, dry and intact.  Psychiatric: He has a normal mood and affect. His behavior is normal. Judgment and thought content normal.    ED Course  Procedures (including critical care  time)  Labs Reviewed - No data to display No results found.   1. Back ache       MDM  Patient recurrently seeking pain relievers for chronic back pain in the emergency department. This is his fourth visit within the last month. He relates having problems getting to a specialist. He is no primary care provider. He was counseled that the emergency department is not a viable source for ongoing pain management. He is given a limited prescription for Percocet today.   Plan: Home Medications- Percocet 3 12; Home Treatments- rest; Recommended follow up- with PCP and a Spine Specialist ASAP       Flint Melter, MD 04/17/11 [phone]

## 2011-04-17 NOTE — ED Notes (Signed)
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 Hx of chronic back pain.

## 2011-04-17 NOTE — Discharge Instructions (Signed)
Call [facility] for a followup appointment, as soon as possible to be seen for your back problem. Do not climb ladders or drive a vehicle taking the narcotic pain reliever. Use the resource guide to find a primary care Dr. Emergency Department cannot continue to prescribe pain relievers for you, on an ongoing basis.   Back Exercises Back exercises help treat and prevent back injuries. The goal of back exercises is to increase the strength of your abdominal and back muscles and the flexibility of your back. These exercises should be started when you no longer have back pain. Back exercises include:  Pelvic Tilt. Lie on your back with your knees bent. Tilt your pelvis until the lower part of your back is against the floor. Hold this position 5 to 10 sec and repeat 5 to 10 times.   Knee to Chest. Pull first 1 knee up against your chest and hold for 20 to 30 seconds, repeat this with the other knee, and then both knees. This may be done with the other leg straight or bent, whichever feels better.   Sit-Ups or Curl-Ups. Bend your knees 90 degrees. Start with tilting your pelvis, and do a partial, slow sit-up, lifting your trunk only 30 to 45 degrees off the floor. Take at least 2 to 3 seconds for each sit-up. Do not do sit-ups with your knees out straight. If partial sit-ups are difficult, simply do the above but with only tightening your abdominal muscles and holding it as directed.   Hip-Lift. Lie on your back with your knees flexed 90 degrees. Push down with your feet and shoulders as you raise your hips a couple inches off the floor; hold for 10 seconds, repeat 5 to 10 times.   Back arches. Lie on your stomach, propping yourself up on bent elbows. Slowly press on your hands, causing an arch in your low back. Repeat 3 to 5 times. Any initial stiffness and discomfort should lessen with repetition over time.   Shoulder-Lifts. Lie face down with arms beside your body. Keep hips and torso  pressed to floor as you slowly lift your head and shoulders off the floor.  Do not overdo your exercises, especially in the beginning. Exercises may cause you some mild back discomfort which lasts for a few minutes; however, if the pain is more severe, or lasts for more than 15 minutes, do not continue exercises until you see your caregiver. Improvement with exercise therapy for back problems is slow.  See your caregivers for assistance with developing a proper back exercise program. Document Released: 02/02/2004 Document Revised: 12/14/2010 Document Reviewed: 12/25/2004 [facility] Patient Information 2012 [place], [place].Back Pain, Adult Low back pain is very common. About 1 in 5 people have back pain.The cause of low back pain is rarely dangerous. The pain often gets better over time.About half of people with a sudden onset of back pain feel better in just 2 weeks. About 8 in 10 people feel better by 6 weeks.  CAUSES Some common causes of back pain include:  Strain of the muscles or ligaments supporting the spine.   Wear and tear (degeneration) of the spinal discs.   Arthritis.   Direct injury to the back.  DIAGNOSIS Most of the time, the direct cause of low back pain is not known.However, back pain can be treated effectively even when the exact cause of the pain is unknown.Answering your caregiver's questions about your overall health and symptoms is one of the most accurate ways to make  sure the cause of your pain is not dangerous. If your caregiver needs more information, he or she may order lab work or imaging tests (X-rays or MRIs).However, even if imaging tests show changes in your back, this usually does not require surgery. HOME CARE INSTRUCTIONS For many people, back pain returns.Since low back pain is rarely dangerous, it is often a condition that people can learn to [facility] their own.   Remain active. It is stressful on the back to sit or stand in one place. Do not sit, drive,  or stand in one place for more than 30 minutes at a time. Take short walks on level surfaces as soon as pain allows.Try to increase the length of time you walk each day.   Do not stay in bed.Resting more than 1 or 2 days can delay your recovery.   Do not avoid exercise or work.Your body is made to move.It is not dangerous to be active, even though your back may hurt.Your back will likely heal faster if you return to being active before your pain is gone.   Pay attention to your body when you bend and lift. Many people have less discomfortwhen lifting if they bend their knees, keep the load close to their bodies,and avoid twisting. Often, the most comfortable positions are those that put less stress on your recovering back.   Find a comfortable position to sleep. Use a firm mattress and lie on your side with your knees slightly bent. If you lie on your back, put a pillow under your knees.   Only take over-the-counter or prescription medicines as directed by your caregiver. Over-the-counter medicines to reduce pain and inflammation are often the most helpful.Your caregiver may prescribe muscle relaxant drugs.These medicines help dull your pain so you can more quickly return to your normal activities and healthy exercise.   Put ice on the injured area.   Put ice in a plastic bag.   Place a towel between your skin and the bag.   Leave the ice on for 15 to 20 minutes, 3 to 4 times a day for the first 2 to 3 days. After that, ice and heat may be alternated to reduce pain and spasms.   Ask your caregiver about trying back exercises and gentle massage. This may be of some benefit.   Avoid feeling anxious or stressed.Stress increases muscle tension and can worsen back pain.It is important to recognize when you are anxious or stressed and learn ways to manage it.Exercise is a great option.  SEEK MEDICAL CARE IF:  You have pain that is not relieved with rest or medicine.   You have pain  that does not improve in 1 week.   You have new symptoms.   You are generally not feeling well.  SEEK IMMEDIATE MEDICAL CARE IF:   You have pain that radiates from your back into your legs.   You develop new bowel or bladder control problems.   You have unusual weakness or numbness in your arms or legs.   You develop nausea or vomiting.   You develop abdominal pain.   You feel faint.  Document Released: 12/25/2004 Document Revised: 12/14/2010 Document Reviewed: 05/15/2010 [facility] Patient Information 2012 [place], [place].Marland Kitchen  RESOURCE GUIDE  Dental Problems  Patients with Medicaid: [facility] [phone] [address]  [address] Phone:  [phone]                                                  Phone:  [phone]  If unable to pay or uninsured, contact:  [facility] or [facility]. to become qualified for the adult dental clinic.  Chronic Pain Problems Contact [facility]  [phone] Patients need to be referred by their primary care doctor.  Insufficient Money for Medicine Contact [employer] Way:  call "211" or [facility] 859-475-2301.  No Primary Care Doctor Call [facility]  930-320-1110 Other agencies that provide inexpensive medical care    [facility]  [phone]    [facility]  [phone]    [facility]  [phone]    [facility]  [phone]    [facility]  639-278-5264    [facility]  [phone]  Psychological Services [facility]  [phone] [facility]  [phone] [facility]   [phone] (emergency services 718-698-8126)  Substance Abuse Resources [facility]  430-270-1537 [facility] 7627725448 [facility] [place] 816-205-4928 [phone] Residential & Outpatient Substance Abuse Program   509-314-1436  Abuse/Neglect [facility] Child Abuse Hotline (224) 173-5166 [facility] Child Abuse Hotline 8737250811 (After Hours)  Emergency Shelter [facility] 971-309-6457  Maternity Homes Room at the [place] [facility] [place] 973-755-7966 [facility] [phone]  MRSA Hotline #:   419-093-2986    [facility] Resources  [facility]     [employer]                          [facility] Dept. [address] [place]                       [address]      [address]  Blondell Reveal Phone:  [phone]                                   Phone:  [phone]                 Phone:  [phone]  [facility] Phone:  [phone]  [facility] Child Abuse Hotline 267-449-1228 [phone] (After Hours)

## 2011-05-29 ENCOUNTER — Emergency Department (HOSPITAL_COMMUNITY)
Admission: EM | Admit: 2011-05-29 | Discharge: 2011-05-29 | Disposition: A | Discharge status: Home / Self Care | Payer: Self-pay | Attending: Emergency Medicine | Admitting: Emergency Medicine

## 2011-05-29 ENCOUNTER — Encounter (HOSPITAL_COMMUNITY): Payer: Self-pay

## 2011-05-29 ENCOUNTER — Emergency Department (HOSPITAL_COMMUNITY): Payer: Self-pay

## 2011-05-29 DIAGNOSIS — M545 Low back pain, unspecified: Secondary | ICD-10-CM | POA: Insufficient documentation

## 2011-05-29 DIAGNOSIS — F17200 Nicotine dependence, unspecified, uncomplicated: Secondary | ICD-10-CM | POA: Insufficient documentation

## 2011-05-29 MED ORDER — OXYCODONE-ACETAMINOPHEN 5-325 MG PO TABS
2.0000 | ORAL_TABLET | Freq: Once | ORAL | Status: AC
  Administered 2011-05-29: 2 via ORAL
  Filled 2011-05-29: qty 2

## 2011-05-29 MED ORDER — NAPROXEN 500 MG PO TABS: 500.0000 mg | ORAL_TABLET | Freq: Two times a day (BID) | ORAL | Status: DC

## 2011-05-29 MED ORDER — METHOCARBAMOL 500 MG PO TABS: 500.0000 mg | ORAL_TABLET | Freq: Two times a day (BID) | ORAL | Status: AC

## 2011-05-29 MED ORDER — IBUPROFEN 800 MG PO TABS
800.0000 mg | ORAL_TABLET | Freq: Once | ORAL | Status: AC
  Administered 2011-05-29: 800 mg via ORAL
  Filled 2011-05-29: qty 1

## 2011-05-29 MED ORDER — HYDROCODONE-ACETAMINOPHEN 5-325 MG PO TABS: 2.0000 | ORAL_TABLET | ORAL | Status: AC | PRN

## 2011-05-29 NOTE — Discharge Instructions (Signed)
Back Pain, Adult Low back pain is very common. About 1 in 5 people have back pain.The cause of low back pain is rarely dangerous. The pain often gets better over time.About half of people with a sudden onset of back pain feel better in just 2 weeks. About 8 in 10 people feel better by 6 weeks.  CAUSES Some common causes of back pain include:  Strain of the muscles or ligaments supporting the spine.   Wear and tear (degeneration) of the spinal discs.   Arthritis.   Direct injury to the back.  DIAGNOSIS Most of the time, the direct cause of low back pain is not known.However, back pain can be treated effectively even when the exact cause of the pain is unknown.Answering your caregiver's questions about your overall health and symptoms is one of the most accurate ways to make sure the cause of your pain is not dangerous. If your caregiver needs more information, he or she may order lab work or imaging tests (X-rays or MRIs).However, even if imaging tests show changes in your back, this usually does not require surgery. HOME CARE INSTRUCTIONS For many people, back pain returns.Since low back pain is rarely dangerous, it is often a condition that people can learn to manageon their own.   Remain active. It is stressful on the back to sit or stand in one place. Do not sit, drive, or stand in one place for more than 30 minutes at a time. Take short walks on level surfaces as soon as pain allows.Try to increase the length of time you walk each day.   Do not stay in bed.Resting more than 1 or 2 days can delay your recovery.   Do not avoid exercise or work.Your body is made to move.It is not dangerous to be active, even though your back may hurt.Your back will likely heal faster if you return to being active before your pain is gone.   Pay attention to your body when you bend and lift. Many people have less discomfortwhen lifting if they bend their knees, keep the load close to their  bodies,and avoid twisting. Often, the most comfortable positions are those that put less stress on your recovering back.   Find a comfortable position to sleep. Use a firm mattress and lie on your side with your knees slightly bent. If you lie on your back, put a pillow under your knees.   Only take over-the-counter or prescription medicines as directed by your caregiver. Over-the-counter medicines to reduce pain and inflammation are often the most helpful.Your caregiver may prescribe muscle relaxant drugs.These medicines help dull your pain so you can more quickly return to your normal activities and healthy exercise.   Put ice on the injured area.   Put ice in a plastic bag.   Place a towel between your skin and the bag.   Leave the ice on for 15 to 20 minutes, 3 to 4 times a day for the first 2 to 3 days. After that, ice and heat may be alternated to reduce pain and spasms.   Ask your caregiver about trying back exercises and gentle massage. This may be of some benefit.   Avoid feeling anxious or stressed.Stress increases muscle tension and can worsen back pain.It is important to recognize when you are anxious or stressed and learn ways to manage it.Exercise is a great option.  SEEK MEDICAL CARE IF:  You have pain that is not relieved with rest or medicine.   You have   pain that does not improve in 1 week.   You have new symptoms.   You are generally not feeling well.  SEEK IMMEDIATE MEDICAL CARE IF:   You have pain that radiates from your back into your legs.   You develop new bowel or bladder control problems.   You have unusual weakness or numbness in your arms or legs.   You develop nausea or vomiting.   You develop abdominal pain.   You feel faint.  Document Released: 12/25/2004 Document Revised: 12/14/2010 Document Reviewed: 05/15/2010 [employer] Patient Information 2012 [employer]. 

## 2011-05-29 NOTE — ED Notes (Signed)
Pt reports having low back pain since sat, was on ladder and twisted the wrong way.

## 2011-05-29 NOTE — ED Provider Notes (Signed)
History   This chart was scribed for Glynn Octave, MD by Clarita Crane. The patient was seen in room APA12/APA12. Patient's care was started at 1444.    CSN: [id number]  Arrival date & time 05/29/11  1444   First MD Initiated Contact with Patient 05/29/11 1516      Chief Complaint  Patient presents with  . Back Pain    (Consider location/radiation/quality/duration/timing/severity/associated sxs/prior treatment) HPI [name] is a 41 y.o. male who presents to the Emergency Department complaining of constant moderate to severe lower back pain localized to midline which intermittent radiates to LLE onset several days ago but worse the past 3 days. States back pain is aggravated with movement and twisting of back. Reports he has been performing an increased amount of bending and twisting lately with use of a ladder at work. States current back pain is similar to that previously experienced. Denies fever, vomiting, incontinence, chest pain, SOB. Patient with h/o chronic back pain, crohn's disease.   Past Medical History  Diagnosis Date  . Other, mixed, or unspecified nondependent drug abuse, unspecified   . Personal history of unspecified digestive disease   . Abdominal pain, unspecified site   . Bipolar disorder, unspecified   . Backache, unspecified   . Tobacco use disorder   . Crohn's   . Chronic back pain     Past Surgical History  Procedure Date  . Right inguinal hernia repair     Family History  Problem Relation Age of Onset  . Lung cancer Father   . Cancer Father     lung  . Heart disease Mother     History  Substance Use Topics  . Smoking status: Current Everyday Smoker    Types: Cigarettes  . Smokeless tobacco: Not on file  . Alcohol Use: Yes     daily      Review of Systems A complete 10 system review of systems was obtained and all systems are negative except as noted in the HPI and PMH.   Allergies  Fluoxetine hcl; Ketorolac tromethamine;  Nsaids; and Ultram  Home Medications   Current Outpatient Rx  Name Route Sig Dispense Refill  . ALBUTEROL SULFATE HFA 108 (90 BASE) MCG/ACT IN AERS Inhalation Inhale 2 puffs into the lungs every 6 (six) hours as needed. for shortness of breath    . ZANAFLEX 4 MG PO TABS  TAKE 1 TABLET EVERY 12   HOURS AS NEEDED FORMUSCLE SPASMS. 60 each 1  . OMEPRAZOLE 20 MG PO CPDR Oral Take 20 mg by mouth daily as needed. For indigestion     . PROMETHAZINE HCL 25 MG PO TABS Oral Take 1 tablet (25 mg total) by mouth every 6 (six) hours as needed for nausea. 10 tablet 0    BP 114/63  Pulse 62  Temp(Src) 98.5 F (36.9 C) (Oral)  Resp 20  Ht 6\' 1"  (1.854 m)  Wt 170 lb (77.111 kg)  BMI 22.43 kg/m2  SpO2 100%  Physical Exam  Nursing note and vitals reviewed. Constitutional: He is oriented to person, place, and time. He appears well-developed and well-nourished. No distress.  HENT:  Head: Normocephalic and atraumatic.  Eyes: EOM are normal. Pupils are equal, round, and reactive to light.  Neck: Neck supple. No tracheal deviation present.  Cardiovascular: Normal rate.   Pulmonary/Chest: Effort normal. No respiratory distress.  Abdominal: Soft. He exhibits no distension.  Musculoskeletal: Normal range of motion. He exhibits tenderness. He exhibits no edema.  Midline lumbar tenderness noted.   Neurological: He is alert and oriented to person, place, and time. He has normal reflexes. No sensory deficit.       5/5 strength of bilateral lower extremities. Bilateral ankle plantar and dorsiflexion intact. Great toe extension normal.   Skin: Skin is warm and dry.  Psychiatric: He has a normal mood and affect. His behavior is normal.    ED Course  Procedures (including critical care time)  DIAGNOSTIC STUDIES: Oxygen Saturation is 100% on room air, normal by my interpretation.    COORDINATION OF CARE:    Labs Reviewed - No data to display Dg Lumbar Spine Complete  05/29/2011  *RADIOLOGY  REPORT*  Clinical Data: Low back pain.  LUMBAR SPINE - COMPLETE 4+ VIEW  Comparison: 03/28/2011  Findings: There are five lumbar-type vertebral bodies.  No fracture or malalignment.  Disc spaces well maintained.  SI joints are symmetric.  IMPRESSION: Normal study.  Original Report Authenticated By: Cyndie Chime, M.D.     No diagnosis found.    MDM  Acute on chronic low back pain radiating to left leg. Patient states he felt a ladder a few months ago his pain is worsening since denies any bowel or bladder incontinence, fevers, numbness, tingling, weakness, vomiting or IV drug use.  tenderness to palpation in the midline without step-off or deformity.  No neurological red flags.  Review of narcotic database shows several small narcotic prescriptions from the ED, none since April 9.  Will prescribe antiinflammatories and muscle relaxers.    I personally performed the services described in this documentation, which was scribed in my presence.  The recorded information has been reviewed and considered.    Glynn Octave, MD 05/29/11 [phone]

## 2011-07-02 ENCOUNTER — Encounter (HOSPITAL_COMMUNITY): Payer: Self-pay | Admitting: *Deleted

## 2011-07-02 ENCOUNTER — Emergency Department (HOSPITAL_COMMUNITY): Payer: Self-pay

## 2011-07-02 ENCOUNTER — Emergency Department (HOSPITAL_COMMUNITY)
Admission: EM | Admit: 2011-07-02 | Discharge: 2011-07-02 | Disposition: A | Discharge status: Home / Self Care | Payer: Self-pay | Attending: Emergency Medicine | Admitting: Emergency Medicine

## 2011-07-02 DIAGNOSIS — R109 Unspecified abdominal pain: Secondary | ICD-10-CM

## 2011-07-02 DIAGNOSIS — F319 Bipolar disorder, unspecified: Secondary | ICD-10-CM | POA: Insufficient documentation

## 2011-07-02 DIAGNOSIS — K5090 Crohn's disease, unspecified, without complications: Secondary | ICD-10-CM | POA: Insufficient documentation

## 2011-07-02 DIAGNOSIS — F17200 Nicotine dependence, unspecified, uncomplicated: Secondary | ICD-10-CM | POA: Insufficient documentation

## 2011-07-02 LAB — DIFFERENTIAL
Eosinophils Relative: 1 % (ref 0–5)
Lymphocytes Relative: 17 % (ref 12–46)
Lymphs Abs: 1.6 10*3/uL (ref 0.7–4.0)
Monocytes Absolute: 0.4 10*3/uL (ref 0.1–1.0)

## 2011-07-02 LAB — BASIC METABOLIC PANEL
CO2: 25 mEq/L (ref 19–32)
Calcium: 9.7 mg/dL (ref 8.4–10.5)
Creatinine, Ser: 0.74 mg/dL (ref 0.50–1.35)
Glucose, Bld: 96 mg/dL (ref 70–99)

## 2011-07-02 LAB — URINALYSIS, ROUTINE W REFLEX MICROSCOPIC
Bilirubin Urine: NEGATIVE
Glucose, UA: NEGATIVE mg/dL
Hgb urine dipstick: NEGATIVE
Ketones, ur: NEGATIVE mg/dL
Protein, ur: NEGATIVE mg/dL

## 2011-07-02 LAB — CBC
HCT: 41.8 % (ref 39.0–52.0)
MCH: 30.4 pg (ref 26.0–34.0)
MCV: 89.5 fL (ref 78.0–100.0)
RBC: 4.67 MIL/uL (ref 4.22–5.81)
RDW: 12.8 % (ref 11.5–15.5)
WBC: 9.8 10*3/uL (ref 4.0–10.5)

## 2011-07-02 LAB — HEPATIC FUNCTION PANEL
ALT: 16 U/L (ref 0–53)
Alkaline Phosphatase: 73 U/L (ref 39–117)
Bilirubin, Direct: 0.1 mg/dL (ref 0.0–0.3)
Indirect Bilirubin: 0.3 mg/dL (ref 0.3–0.9)

## 2011-07-02 MED ORDER — ONDANSETRON HCL 4 MG PO TABS: 4.0000 mg | ORAL_TABLET | Freq: Three times a day (TID) | ORAL | Status: AC | PRN

## 2011-07-02 MED ORDER — ONDANSETRON HCL 4 MG/2ML IJ SOLN
4.0000 mg | INTRAMUSCULAR | Status: AC | PRN
  Administered 2011-07-02 (×2): 4 mg via INTRAVENOUS
  Filled 2011-07-02 (×2): qty 2

## 2011-07-02 MED ORDER — MORPHINE SULFATE 4 MG/ML IJ SOLN
4.0000 mg | INTRAMUSCULAR | Status: AC | PRN
  Administered 2011-07-02 (×2): 4 mg via INTRAVENOUS
  Filled 2011-07-02 (×2): qty 1

## 2011-07-02 MED ORDER — METRONIDAZOLE 500 MG PO TABS: 500.0000 mg | ORAL_TABLET | Freq: Two times a day (BID) | ORAL | Status: AC

## 2011-07-02 MED ORDER — CIPROFLOXACIN HCL 500 MG PO TABS: 500.0000 mg | ORAL_TABLET | Freq: Two times a day (BID) | ORAL | Status: AC

## 2011-07-02 MED ORDER — SODIUM CHLORIDE 0.9 % IV SOLN
Freq: Once | INTRAVENOUS | Status: AC
  Administered 2011-07-02: 1000 mL via INTRAVENOUS

## 2011-07-02 MED ORDER — IOHEXOL 300 MG/ML  SOLN
100.0000 mL | Freq: Once | INTRAMUSCULAR | Status: AC | PRN
  Administered 2011-07-02: 100 mL via INTRAVENOUS

## 2011-07-02 MED ORDER — OXYCODONE-ACETAMINOPHEN 5-325 MG PO TABS: ORAL_TABLET | ORAL | Status: AC

## 2011-07-02 NOTE — ED Provider Notes (Signed)
History     CSN: [id number]  Arrival date & time 07/02/11  1213   First MD Initiated Contact with Patient 07/02/11 1417      Chief Complaint  Patient presents with  . Abdominal Pain    HPI Pt was seen at 1435.  Per pt, c/o gradual onset and persistence of constant left lower abd "pain" for the past several days, worse since yesterday.  Has been associated with nausea.  Describes the pain as "it feels like another hernia."  States the pain worsened when he "picked up a mop bucket" yesterday.  Had BM today which was normal for him.  Denies fevers, no back pain, no vomiting/diarrhea, no black or blood in stools, no rash, no dysuria/hematuria, no testicular pain/swelling.     Past Medical History  Diagnosis Date  . Other, mixed, or unspecified nondependent drug abuse, unspecified   . Personal history of unspecified digestive disease   . Abdominal pain, unspecified site   . Bipolar disorder, unspecified   . Backache, unspecified   . Tobacco use disorder   . Crohn's   . Chronic back pain     Past Surgical History  Procedure Date  . Right inguinal hernia repair   . Hernia repair     Family History  Problem Relation Age of Onset  . Lung cancer Father   . Cancer Father     lung  . Heart disease Mother     History  Substance Use Topics  . Smoking status: Current Everyday Smoker    Types: Cigarettes  . Smokeless tobacco: Not on file  . Alcohol Use: Yes     daily     Review of Systems ROS: Statement: All systems negative except as marked or noted in the HPI; Constitutional: Negative for fever and chills. ; ; Eyes: Negative for eye pain, redness and discharge. ; ; ENMT: Negative for ear pain, hoarseness, nasal congestion, sinus pressure and sore throat. ; ; Cardiovascular: Negative for chest pain, palpitations, diaphoresis, dyspnea and peripheral edema. ; ; Respiratory: Negative for cough, wheezing and stridor. ; ; Gastrointestinal: +abd pain, nausea. Negative for vomiting,  diarrhea, blood in stool, hematemesis, jaundice and rectal bleeding.; ; Genitourinary: Negative for dysuria, flank pain and hematuria. ; Genital:  No penile drainage or rash, no testicular pain or swelling, no scrotal rash or swelling.;; Musculoskeletal: Negative for back pain and neck pain. Negative for swelling and trauma.; ; Skin: Negative for pruritus, rash, abrasions, blisters, bruising and skin lesion.; ; Neuro: Negative for headache, lightheadedness and neck stiffness. Negative for weakness, altered level of consciousness , altered mental status, extremity weakness, paresthesias, involuntary movement, seizure and syncope.      Allergies  Fluoxetine hcl; Ketorolac tromethamine; Nsaids; and Ultram  Home Medications   Current Outpatient Rx  Name Route Sig Dispense Refill  . ACETAMINOPHEN 500 MG PO TABS Oral Take 500 mg by mouth every 6 (six) hours as needed. For pain    . ALBUTEROL SULFATE HFA 108 (90 BASE) MCG/ACT IN AERS Inhalation Inhale 2 puffs into the lungs every 6 (six) hours as needed. for shortness of breath    . ADULT MULTIVITAMIN W/MINERALS CH Oral Take 1 tablet by mouth daily.    Marland Kitchen ZANAFLEX 4 MG PO TABS  TAKE 1 TABLET EVERY 12   HOURS AS NEEDED FORMUSCLE SPASMS. 60 each 1  . PROMETHAZINE HCL 25 MG PO TABS Oral Take 1 tablet (25 mg total) by mouth every 6 (six) hours as needed for nausea.  10 tablet 0    BP 120/80  Pulse 72  Temp 97.6 F (36.4 C) (Oral)  Resp 20  Ht 6' (1.829 m)  Wt 175 lb (79.379 kg)  BMI 23.73 kg/m2  SpO2 100%  Physical Exam 1440: Physical examination:  Nursing notes reviewed; Vital signs and O2 SAT reviewed;  Constitutional: Well developed, Well nourished, Well hydrated, Uncomfortable appearing; Head:  Normocephalic, atraumatic; Eyes: EOMI, PERRL, No scleral icterus; ENMT: Mouth and pharynx normal, Mucous membranes moist; Neck: Supple, Full range of motion, No lymphadenopathy; Cardiovascular: Regular rate and rhythm, No murmur, rub, or gallop;  Respiratory: Breath sounds clear & equal bilaterally, No rales, rhonchi, wheezes.  Speaking full sentences with ease, Normal respiratory effort/excursion; Chest: Nontender, Movement normal; Abdomen: Soft, +left lower abd area tender to palp, Nondistended, Normal bowel sounds; Genitourinary: No CVA tenderness;  Genital exam performed with pt permission and male ED RN chaparone present during exam.  Pt examined laying and standing.  No perineal erythema.  No penile lesions or drainage.  No scrotal erythema, edema or tenderness to palp.  Normal testicular lie.  No testicular tenderness to palp.  +cremasteric reflexes bilat.  No inguinal LAN or palpable masses.  No rash, no erythema, no ecchymosis.; Extremities: Pulses normal, No tenderness, No edema, No calf edema or asymmetry.; Neuro: AA&Ox3, Major CN grossly intact.  Speech clear. No gross focal motor or sensory deficits in extremities.; Skin: Color normal, Warm, Dry.    ED Course  Procedures    MDM  MDM Reviewed: nursing note, vitals and previous chart Interpretation: CT scan and labs     Results for orders placed during the hospital encounter of 07/02/11  CBC      Component Value Range   WBC 9.8  4.0 - 10.5 K/uL   RBC 4.67  4.22 - 5.81 MIL/uL   Hemoglobin 14.2  13.0 - 17.0 g/dL   HCT 16.1  09.6 - 04.5 %   MCV 89.5  78.0 - 100.0 fL   MCH 30.4  26.0 - 34.0 pg   MCHC 34.0  30.0 - 36.0 g/dL   RDW 40.9  81.1 - 91.4 %   Platelets 240  150 - 400 K/uL  DIFFERENTIAL      Component Value Range   Neutrophils Relative 78 (*) 43 - 77 %   Neutro Abs 7.6  1.7 - 7.7 K/uL   Lymphocytes Relative 17  12 - 46 %   Lymphs Abs 1.6  0.7 - 4.0 K/uL   Monocytes Relative 4  3 - 12 %   Monocytes Absolute 0.4  0.1 - 1.0 K/uL   Eosinophils Relative 1  0 - 5 %   Eosinophils Absolute 0.1  0.0 - 0.7 K/uL   Basophils Relative 0  0 - 1 %   Basophils Absolute 0.0  0.0 - 0.1 K/uL  BASIC METABOLIC PANEL      Component Value Range   Sodium 139  135 - 145 mEq/L    Potassium 4.2  3.5 - 5.1 mEq/L   Chloride 105  96 - 112 mEq/L   CO2 25  19 - 32 mEq/L   Glucose, Bld 96  70 - 99 mg/dL   BUN 13  6 - 23 mg/dL   Creatinine, Ser 7.82  0.50 - 1.35 mg/dL   Calcium 9.7  8.4 - 95.6 mg/dL   GFR calc non Af Amer >90  >90 mL/min   GFR calc Af Amer >90  >90 mL/min  URINALYSIS, ROUTINE W REFLEX MICROSCOPIC  Component Value Range   Color, Urine YELLOW  YELLOW   APPearance CLEAR  CLEAR   Specific Gravity, Urine 1.025  1.005 - 1.030   pH 6.0  5.0 - 8.0   Glucose, UA NEGATIVE  NEGATIVE mg/dL   Hgb urine dipstick NEGATIVE  NEGATIVE   Bilirubin Urine NEGATIVE  NEGATIVE   Ketones, ur NEGATIVE  NEGATIVE mg/dL   Protein, ur NEGATIVE  NEGATIVE mg/dL   Urobilinogen, UA 1.0  0.0 - 1.0 mg/dL   Nitrite NEGATIVE  NEGATIVE   Leukocytes, UA NEGATIVE  NEGATIVE  HEPATIC FUNCTION PANEL      Component Value Range   Total Protein 6.8  6.0 - 8.3 g/dL   Albumin 3.9  3.5 - 5.2 g/dL   AST 18  0 - 37 U/L   ALT 16  0 - 53 U/L   Alkaline Phosphatase 73  39 - 117 U/L   Total Bilirubin 0.4  0.3 - 1.2 mg/dL   Bilirubin, Direct 0.1  0.0 - 0.3 mg/dL   Indirect Bilirubin 0.3  0.3 - 0.9 mg/dL  LIPASE, BLOOD      Component Value Range   Lipase 19  11 - 59 U/L   Ct Abdomen Pelvis W Contrast 07/02/2011  *RADIOLOGY REPORT*  Clinical Data: Left lower abdominal and groin pain, left inguinal pain since pain mapping mild pocket, history of stones, Crohn's disease, question inguinal hernia  CT ABDOMEN AND PELVIS WITH CONTRAST  Technique:  Multidetector CT imaging of the abdomen and pelvis was performed following the standard protocol during bolus administration of intravenous contrast. Sagittal and coronal MPR images reconstructed from axial data set.  Contrast: OMNIPAQUE IOHEXOL 300 MG/ML  SOLN Dilute oral contrast.  Comparison: 08/12/2007  Findings: Lung bases clear. Questionable wall thickening of distal thoracic esophagus, unchanged. Bilateral renal cysts, largest posterior mid left  kidney 3.0 x 2.4 cm image 31. Liver, spleen, pancreas, kidneys, and adrenal glands otherwise normal appearance. Stomach and small bowel loops normal appearance. Bladder and ureters normal appearance. Colon unopacified at time of imaging. Normal appendix.  Colonic wall appears slightly prominent in thickness at multiple sites including proximal transverse colon and more prominently at the descending colon and sigmoid colon though part of this may be related to underdistension. No definite pericolic inflammatory changes identified. Diverticula are present at the sigmoid colon. No mass, adenopathy, or free fluid. Abdominal wall fascial planes appear intact. No definite inguinal hernias are identified. Bones unremarkable.  IMPRESSION: No definite evidence of inguinal hernia. Scattered areas of prominence of the colonic wall, which may be in part related to underdistension but I am concerned there could be a colonic wall thickening at the descending and sigmoid junction, question colitis. This could be related to infection or Crohn's disease, of which the patient has a history. No pericolic inflammatory changes identified. Stable mildly prominent wall of the distal thoracic esophagus.  Original Report Authenticated By: Lollie Marrow, M.D.     6:41 PM:   No hernia on CT scan or on exam.  Testicles NT.  Pt has been ambulatory around the ED with steady, upright gait, easy resps, NAD.  Climbs on and off the stretcher by himself without difficulty.  Pt states he "sometimes" has diarrhea, but is unsure if he is having a crohn's flair.  WBC count is normal, VSS/afebrile.  Will tx for possible colitis, pain, and pt was encouraged to f/u with his PMD and Surgical MD.  Dx testing d/w pt and family.  Questions answered.  Verb understanding, agreeable to d/c home with outpt f/u.        Laray Anger, DO 07/05/11 1929

## 2011-07-02 NOTE — ED Notes (Signed)
Pt reports has left inguinal hernia.  Says yesterday picked up a mop bucket and pain got more severe.  C/o nausea but no vomiting.  LBM was this am and was normal per pt.

## 2011-07-02 NOTE — Discharge Instructions (Signed)
RESOURCE GUIDE  Chronic Pain Problems: Contact [facility]  [phone] Patients need to be referred by their primary care doctor.  Insufficient Money for Medicine: Contact United Way:  call "211" or [facility] [phone].  No Primary Care Doctor: - Call [facility]  [phone] - can help you locate a primary care doctor that  accepts your insurance, provides certain services, etc. - Physician Referral Service- 1-800-533-3463  Agencies that provide inexpensive medical care: - [facility]  [phone] - [facility]  [phone] - [facility]  [phone] - [facility]  [phone] - Planned Parenthood  [phone] - [facility]  [phone]  Medicaid-accepting [place] Providers: - [facility]- [address]  [phone], Mon-Fri 9am-7pm, Sat 9am-1pm - [facility]- [address], [address]  [phone] - [facility]- [address], [address]  [phone] - [facility]- [address] [place] [address]  [phone] - Veita Bland- [address], [phone]  Only accepts [place] Access Medicaid patients after they have their name  applied to their card  Self Pay (no insurance) in [place]: - Sickle Cell Patients: Dr Eric Dean, [facility]  [address], [phone] - [facility]- [address]  [phone]       -     [facility] [place]- [address]       -     [facility]- see information above (Speak to Pam H if you do not have insurance)       -  [facility]- [address], [phone]       -  [facility] [place]- [address],  [phone]       -  [facility]- [address] [place] [address], [phone]       -  Dr Osei-Bonsu-  [address], [place], [phone]       -  [facility]- [address], [phone]       -  [facility]- [address] [place] [address], [phone], also [address], [phone]       -    [facility]- [address], [phone], 1st & 3rd Saturday   every month, 10am-1pm  1) Find a Doctor and Pay Out of Pocket Although you won't have to find out who is covered by your insurance plan, it is a good idea to ask around and get recommendations. You will then need to call the office and see if the doctor you have chosen will accept you as a new patient and what types of options they offer for patients who are self-pay. Some doctors offer discounts or will set up payment plans for their patients who do not have insurance, but you will need to ask so you aren't surprised when you get to your appointment.  2) Contact Your Local Health Department Not all health departments have doctors that can see patients for sick visits, but many do, so it is worth a call to see if yours does. If you don't know where your local health department is, you can check in your phone book. The CDC also has a tool to help you locate your state's health department, and many state websites also have   listings of all of their local health departments.  3) Find a Walk-in Clinic If your illness is not likely to be very severe or complicated, you may want to try a walk in clinic. These are popping up all over the country in pharmacies, drugstores, and shopping centers. They're usually staffed by nurse practitioners or physician assistants that have been trained to treat common illnesses and complaints. They're usually fairly quick and inexpensive. However, if you have serious medical issues or chronic medical problems, these are probably not your best option  STD Testing - [place] [facility] [place], [facility] Clinic, [address], [place], phone [phone] or 1-877-539-9860.  Monday - Friday, call for an appointment. - [place]  [facility] [place], [facility] Clinic, [address], [place], phone [phone] or 1-877-539-9860.  Monday - Friday, call for an appointment.  Abuse/Neglect: - [place] Child Abuse Hotline (336) 641-3795 - [place] Child Abuse Hotline 800-378-5315 (After Hours)  Emergency Shelter:  [facility] (336) 271-5985  Maternity Homes: - [facility] (336) 275-9566 - [facility] (704) 372-4663  MRSA Hotline #:   832-7006  [place] Resources  [facility] [place]  [employer] [place] [facility] [address]                 [address]         [address]  [place]                                               [place]                              [place] Phone:  [phone]                                  Phone:  [phone]                   Phone:  [phone]  [place] [facility], [phone] - [place] Services - [facility]- 1-888-581-9988       -     [facility] in [place], [address],                                  336-349-4454, Insurance  [place] Child Abuse Hotline (336) 342-1394 or (336) 342-3537 (After Hours)   Behavioral Health Services  Substance Abuse Resources: - [facility]  336-882-2125 - [facility] 336-784-9470 - [facility] 336-285-9073 - [facility] 336-845-3988 - Residential & Outpatient Substance Abuse Program  800-659-3381  Psychological Services: -  Health  832-9600 - [facility]  [phone] - [place] [facility], [address], [place], ACCESS LINE: 1-800-853-5163 or 336-641-4981, Http://www.[facility].com/services/adult.htm  Dental Assistance  If unable to pay or uninsured, contact:  [facility] or [place] [facility] to become qualified for the adult dental  clinic.  Patients with Medicaid:  [facility] [address], [phone], [phone]  If unable   to pay, or uninsured, contact [facility] 416-853-0858) or [facility] Department 272-724-6271 in [place], [phone] in [facility]) to become qualified for the adult dental clinic  Other Low-Cost Community Dental Services: - [facility]- [address] [place], [place], [place], [address], [phone], [address], 2nd and 4th Thursday of the month at 6:30am.  10 clients each day by appointment, can sometimes see walk-in patients if someone does not show for an appointment. [facility]- [address] [place], [place], [address], [phone] - [facility]- [address] [place], [place], [address], [phone] [facility]- [phone] [facility] Health Department- [phone] [facility] [place] [phone]     Take the prescriptions as directed.  Call your regular medical doctor, your GI doctor, and the General Surgeon tomorrow morning to schedule a follow up appointment within the next week.  Return to the Emergency Department immediately sooner if worsening.

## 2011-07-02 NOTE — ED Notes (Signed)
abd pain, says he has a hernia,  Nausea, no vomiting.

## 2011-08-07 ENCOUNTER — Emergency Department (HOSPITAL_COMMUNITY)
Admission: EM | Admit: 2011-08-07 | Discharge: 2011-08-07 | Discharge status: Against Medical Advice | Payer: Self-pay | Attending: Emergency Medicine | Admitting: Emergency Medicine

## 2011-08-07 ENCOUNTER — Encounter (HOSPITAL_COMMUNITY): Payer: Self-pay | Admitting: Emergency Medicine

## 2011-08-07 DIAGNOSIS — F1010 Alcohol abuse, uncomplicated: Secondary | ICD-10-CM | POA: Insufficient documentation

## 2011-08-07 DIAGNOSIS — Z0289 Encounter for other administrative examinations: Secondary | ICD-10-CM | POA: Insufficient documentation

## 2011-08-07 NOTE — ED Provider Notes (Signed)
Note: Pt belligerent and yelling obscenities at staff and law enforcement. Taken in to police custody and removed from the ED prior to my evaluation of the patient.  [name], MD 08/07/11 2243

## 2011-08-07 NOTE — ED Notes (Signed)
Patient states "I am here for my mental and physical pain. I need something right now." Patient cursing and uncooperative at triage. Admits to ETOH tonight. States "I drank enough to get me here."

## 2011-08-07 NOTE — ED Notes (Addendum)
Patient cursing and uncooperative. Police officers at bedside. Patient in police officers face cursing and police warned patient he would be arrested if he didn't stop. Patient continues to curse police officers and states "I'll just leave." Patient left in handcuffs with police officers.

## 2011-08-07 NOTE — ED Notes (Signed)
Officer from jail inquiring whether we want to treat patient or not. Advised officer that I was unsure of why patient was here due to him not telling me any complaints. Patient stated "I drank a fifth of wine before I got here." Advised officer that patient arrived on his own and there are no IVC papers for him. Gave officer patient's belongings to take back to jail.

## 2011-12-08 ENCOUNTER — Emergency Department (HOSPITAL_COMMUNITY)
Admission: EM | Admit: 2011-12-08 | Discharge: 2011-12-08 | Disposition: A | Discharge status: Home / Self Care | Payer: Self-pay

## 2011-12-15 ENCOUNTER — Emergency Department (HOSPITAL_COMMUNITY)
Admission: EM | Admit: 2011-12-15 | Discharge: 2011-12-15 | Disposition: A | Discharge status: Home / Self Care | Payer: Self-pay | Attending: Emergency Medicine | Admitting: Emergency Medicine

## 2011-12-15 ENCOUNTER — Encounter (HOSPITAL_COMMUNITY): Payer: Self-pay | Admitting: Emergency Medicine

## 2011-12-15 DIAGNOSIS — M549 Dorsalgia, unspecified: Secondary | ICD-10-CM | POA: Insufficient documentation

## 2011-12-15 DIAGNOSIS — K4090 Unilateral inguinal hernia, without obstruction or gangrene, not specified as recurrent: Secondary | ICD-10-CM | POA: Insufficient documentation

## 2011-12-15 DIAGNOSIS — F319 Bipolar disorder, unspecified: Secondary | ICD-10-CM | POA: Insufficient documentation

## 2011-12-15 DIAGNOSIS — Z9889 Other specified postprocedural states: Secondary | ICD-10-CM | POA: Insufficient documentation

## 2011-12-15 DIAGNOSIS — F1910 Other psychoactive substance abuse, uncomplicated: Secondary | ICD-10-CM | POA: Insufficient documentation

## 2011-12-15 DIAGNOSIS — Z8719 Personal history of other diseases of the digestive system: Secondary | ICD-10-CM | POA: Insufficient documentation

## 2011-12-15 DIAGNOSIS — G8929 Other chronic pain: Secondary | ICD-10-CM | POA: Insufficient documentation

## 2011-12-15 DIAGNOSIS — Z79899 Other long term (current) drug therapy: Secondary | ICD-10-CM | POA: Insufficient documentation

## 2011-12-15 DIAGNOSIS — F17200 Nicotine dependence, unspecified, uncomplicated: Secondary | ICD-10-CM | POA: Insufficient documentation

## 2011-12-15 MED ORDER — OXYCODONE-ACETAMINOPHEN 5-325 MG PO TABS: 1.0000 | ORAL_TABLET | Freq: Four times a day (QID) | ORAL | Status: DC | PRN

## 2011-12-15 NOTE — ED Notes (Signed)
Patient with c/o LLQ abdominal pain around inguinal hernia for past month. Patient lifting yesterday and today, pain is worse.

## 2011-12-15 NOTE — ED Provider Notes (Signed)
History  This chart was scribed for [name] B. Bernette Mayers, MD by Erskine Emery, ED Scribe. This patient was seen in room APA15/APA15 and the patient's care was started at 12:08.   CSN: [id number]  Arrival date & time 12/15/11  1126   First MD Initiated Contact with Patient 12/15/11 1208      Chief Complaint  Patient presents with  . Abdominal Pain    (Consider location/radiation/quality/duration/timing/severity/associated sxs/prior treatment) The history is provided by the patient. No language interpreter was used.  [name] is a 41 y.o. male who presents to the Emergency Department complaining of intermittent LLQ abdominal pain and left-sided inguinal hernia for the past 4-6 months. Pt reports the pain was aggravated by picking up a stack of wood yesterday. Pt denies any associated emesis. Pt was here in June for the same complaint; he had a CAT scan that showed no definite hernia. Pt reports he has a h/o GERD, Crohn's and had another inguinal hernia 2 years ago on the right side, that was previously fixed.   Pt has no PCP or insurance.  Past Medical History  Diagnosis Date  . Other, mixed, or unspecified nondependent drug abuse, unspecified   . Personal history of unspecified digestive disease   . Abdominal pain, unspecified site   . Bipolar disorder, unspecified   . Backache, unspecified   . Tobacco use disorder   . Crohn's   . Chronic back pain     Past Surgical History  Procedure Date  . Right inguinal hernia repair   . Hernia repair     Family History  Problem Relation Age of Onset  . Lung cancer Father   . Cancer Father     lung  . Heart disease Mother     History  Substance Use Topics  . Smoking status: Current Every Day Smoker -- 1.0 packs/day    Types: Cigarettes  . Smokeless tobacco: Not on file  . Alcohol Use: Yes     Comment: daily      Review of Systems A complete 10 system review of systems was obtained and all systems are negative except as  noted in the HPI and PMH.    Allergies  Fluoxetine hcl; Ketorolac tromethamine; Nsaids; and Ultram  Home Medications   Current Outpatient Rx  Name  Route  Sig  Dispense  Refill  . ACETAMINOPHEN 500 MG PO TABS   Oral   Take 500 mg by mouth every 6 (six) hours as needed. For pain         . ALBUTEROL SULFATE HFA 108 (90 BASE) MCG/ACT IN AERS   Inhalation   Inhale 2 puffs into the lungs every 6 (six) hours as needed. for shortness of breath         . ADULT MULTIVITAMIN W/MINERALS CH   Oral   Take 1 tablet by mouth daily.         Marland Kitchen PROMETHAZINE HCL 25 MG PO TABS   Oral   Take 1 tablet (25 mg total) by mouth every 6 (six) hours as needed for nausea.   10 tablet   0   . ZANAFLEX 4 MG PO TABS      TAKE 1 TABLET EVERY 12   HOURS AS NEEDED FORMUSCLE SPASMS.   60 each   1     Triage Vitals: BP 136/69  Pulse 73  Temp 98.2 F (36.8 C) (Oral)  Resp 20  Ht 6\' 1"  (1.854 m)  Wt 170 lb (77.111 kg)  BMI 22.43 kg/m2  SpO2 100%  Physical Exam  Nursing note and vitals reviewed. Constitutional: He is oriented to person, place, and time. He appears well-developed and well-nourished.  HENT:  Head: Normocephalic and atraumatic.  Eyes: EOM are normal. Pupils are equal, round, and reactive to light.  Neck: Normal range of motion. Neck supple.  Cardiovascular: Normal rate, normal heart sounds and intact distal pulses.   Pulmonary/Chest: Effort normal and breath sounds normal.  Abdominal: Bowel sounds are normal. He exhibits no distension.       Bulge in left inguinal area when coughing or standing, but no incarcerated hernia.  Musculoskeletal: Normal range of motion. He exhibits no edema and no tenderness.  Neurological: He is alert and oriented to person, place, and time. He has normal strength. No cranial nerve deficit or sensory deficit.  Skin: Skin is warm and dry. No rash noted.  Psychiatric: He has a normal mood and affect.    ED Course  Procedures (including critical  care time) DIAGNOSTIC STUDIES: Oxygen Saturation is 100% on room air, normal by my interpretation.    COORDINATION OF CARE: 12:10--I evaluated the patient and we discussed a treatment plan including pain medication to which the pt agreed. I instructed the pt to follow up with a specialist for hernia repair. I notified the pt that he probably qualifies for free or reduced insurance.  Labs Reviewed - No data to display No results found.   No diagnosis found.    MDM  Abdomen benign, no incarcerated hernia. No indication for further ED workup. Advised to establish with PCP and follow up with Gen Surg for further eval of hernia.       I personally performed the services described in this documentation, which was scribed in my presence. The recorded information has been reviewed and is accurate.     [name] B. Bernette Mayers, MD 12/15/11 1224

## 2011-12-21 ENCOUNTER — Emergency Department (HOSPITAL_COMMUNITY)
Admission: EM | Admit: 2011-12-21 | Discharge: 2011-12-21 | Disposition: A | Discharge status: Home / Self Care | Payer: Self-pay | Attending: Emergency Medicine | Admitting: Emergency Medicine

## 2011-12-21 ENCOUNTER — Encounter (HOSPITAL_COMMUNITY): Payer: Self-pay | Admitting: *Deleted

## 2011-12-21 DIAGNOSIS — Z8719 Personal history of other diseases of the digestive system: Secondary | ICD-10-CM | POA: Insufficient documentation

## 2011-12-21 DIAGNOSIS — Z79899 Other long term (current) drug therapy: Secondary | ICD-10-CM | POA: Insufficient documentation

## 2011-12-21 DIAGNOSIS — Z8659 Personal history of other mental and behavioral disorders: Secondary | ICD-10-CM | POA: Insufficient documentation

## 2011-12-21 DIAGNOSIS — M549 Dorsalgia, unspecified: Secondary | ICD-10-CM

## 2011-12-21 DIAGNOSIS — J3489 Other specified disorders of nose and nasal sinuses: Secondary | ICD-10-CM | POA: Insufficient documentation

## 2011-12-21 DIAGNOSIS — F17200 Nicotine dependence, unspecified, uncomplicated: Secondary | ICD-10-CM | POA: Insufficient documentation

## 2011-12-21 DIAGNOSIS — M545 Low back pain, unspecified: Secondary | ICD-10-CM | POA: Insufficient documentation

## 2011-12-21 DIAGNOSIS — G8929 Other chronic pain: Secondary | ICD-10-CM | POA: Insufficient documentation

## 2011-12-21 MED ORDER — OXYCODONE-ACETAMINOPHEN 5-325 MG PO TABS: 1.0000 | ORAL_TABLET | Freq: Four times a day (QID) | ORAL | Status: AC | PRN

## 2011-12-21 MED ORDER — OXYCODONE-ACETAMINOPHEN 5-325 MG PO TABS
1.0000 | ORAL_TABLET | Freq: Once | ORAL | Status: AC
  Administered 2011-12-21: 1 via ORAL
  Filled 2011-12-21: qty 1

## 2011-12-21 NOTE — ED Provider Notes (Signed)
History   This chart was scribed for Shelda Jakes, MD by Gerlean Ren, ED Scribe. This patient was seen in room APA03/APA03 and the patient's care was started at 4:25 PM    CSN: [id number]  Arrival date & time 12/21/11  1347   First MD Initiated Contact with Patient 12/21/11 1603      Chief Complaint  Patient presents with  . Abdominal Pain     The history is provided by the patient. No language interpreter was used.   [name] is a 41 y.o. male with h/o chronic back pain, bipolar disorder, and Crohn's disease who presents to the Emergency Department complaining of RLQ pain associated with right inguinal hernia that has been present for 4 months for which he has an appointment with Dr. Lovell Sheehan 12/19.  Pain is burning and radiates to right lower back and into right buttocks.  Pt states he had left inguinal hernia repair.  Pt denies numbness or weakness in right foot, neck pain, fever, sore throat, rhinorrhea, rash, dyspnea, cough, chest pain, dysuria, HA, nausea, emesis, diarrhea.  Pt reports congestion attributed to allergies.  Pt was seen here 12/07 and given 20 5-325mg  Percocet but states he has run out.  Pt is a current everyday smoker and reports alcohol use.   No PCP.  Past Medical History  Diagnosis Date  . Other, mixed, or unspecified nondependent drug abuse, unspecified   . Personal history of unspecified digestive disease   . Abdominal pain, unspecified site   . Bipolar disorder, unspecified   . Backache, unspecified   . Tobacco use disorder   . Crohn's   . Chronic back pain     Past Surgical History  Procedure Date  . Right inguinal hernia repair   . Hernia repair     Family History  Problem Relation Age of Onset  . Lung cancer Father   . Cancer Father     lung  . Heart disease Mother     History  Substance Use Topics  . Smoking status: Current Every Day Smoker -- 1.0 packs/day    Types: Cigarettes  . Smokeless tobacco: Not on file  . Alcohol Use:  Yes     Comment: daily  12/13, says no etoh for 1 year      Review of Systems  Constitutional: Negative for fever.  HENT: Positive for congestion. Negative for rhinorrhea.   Respiratory: Negative for shortness of breath.   Cardiovascular: Negative for chest pain.  Gastrointestinal: Positive for abdominal pain. Negative for nausea, vomiting and diarrhea.  Genitourinary: Negative for dysuria and difficulty urinating.  Musculoskeletal: Positive for back pain.  Skin: Negative for rash.  Neurological: Negative for headaches.    Allergies  Fluoxetine hcl; Ketorolac tromethamine; Nsaids; and Ultram  Home Medications   Current Outpatient Rx  Name  Route  Sig  Dispense  Refill  . ACETAMINOPHEN 500 MG PO TABS   Oral   Take 500 mg by mouth every 6 (six) hours as needed. For pain         . ALBUTEROL SULFATE HFA 108 (90 BASE) MCG/ACT IN AERS   Inhalation   Inhale 2 puffs into the lungs every 6 (six) hours as needed. for shortness of breath         . ADULT MULTIVITAMIN W/MINERALS CH   Oral   Take 1 tablet by mouth daily.         . OXYCODONE-ACETAMINOPHEN 5-325 MG PO TABS   Oral  Take 1-2 tablets by mouth every 6 (six) hours as needed for pain.   20 tablet   0   . OXYCODONE-ACETAMINOPHEN 5-325 MG PO TABS   Oral   Take 1-2 tablets by mouth every 6 (six) hours as needed for pain.   15 tablet   0     BP 118/80  Pulse 65  Temp 97.8 F (36.6 C) (Oral)  Resp 18  Ht 6' (1.829 m)  Wt 170 lb (77.111 kg)  BMI 23.06 kg/m2  SpO2 100%  Physical Exam  Nursing note and vitals reviewed. Constitutional: He is oriented to person, place, and time. He appears well-developed and well-nourished.  HENT:  Head: Normocephalic and atraumatic.  Eyes: Conjunctivae normal and EOM are normal.  Neck: Normal range of motion. No tracheal deviation present.  Cardiovascular: Normal rate, regular rhythm and normal heart sounds.   No murmur heard. Pulmonary/Chest: Effort normal and breath  sounds normal. He has no wheezes.  Abdominal: Soft. Bowel sounds are normal. There is no tenderness.  Genitourinary:       No bulges in groin area, did not stand pt up to confirm hernia, only to confirm that hernia is incarcerated . Testes descended, no swelling, no mass, phallus normal.  Musculoskeletal: Normal range of motion. He exhibits no edema.  Lymphadenopathy:    He has no cervical adenopathy.  Neurological: He is alert and oriented to person, place, and time. No cranial nerve deficit. Coordination normal.  Skin: Skin is warm. No rash noted.  Psychiatric: He has a normal mood and affect.    ED Course  Procedures (including critical care time) DIAGNOSTIC STUDIES: Oxygen Saturation is 100% on room air, normal by my interpretation.    COORDINATION OF CARE: 4:37 PM- Patient informed of clinical course, understands medical decision-making process, and agrees with plan.  Physical findings are inconsistent with pt's report of hernia repair and current pain.  Labs Reviewed - No data to display No results found.   1. Back pain   2. Chronic back pain       MDM   Patient with known history of chronic back pain. Exacerbation of the back on the right side with radiation into the right leg consistent with some mild sciatica. No sensory or motor deficits currently. Patient also has long-standing history of a hernia patient was confusing during the history we thought the pain was on the right groin area the previous right groin hernia repair the new hernias on the left side that was documented previous visit on December 7 no incarceration no bulge currently the hernia is reduced. Patient's pain medication will be renewed he has followup with surgery for the hernia next week hasn't yet to get a primary care Dr. And we'll provide him with a resource guide.     I personally performed the services described in this documentation, which was scribed in my presence. The recorded information  has been reviewed and is accurate.          Shelda Jakes, MD 12/21/11 [phone]

## 2011-12-21 NOTE — ED Notes (Signed)
Patient with no complaints at this time. Respirations even and unlabored. Skin warm/dry. Discharge instructions reviewed with patient at this time. Patient given opportunity to voice concerns/ask questions. Patient discharged at this time and left Emergency Department with steady gait.   

## 2011-12-21 NOTE — ED Notes (Signed)
Pain LLQ and low back pain, says he was moving some things and made the pain worse

## 2012-02-27 ENCOUNTER — Encounter (HOSPITAL_COMMUNITY): Payer: Self-pay | Admitting: *Deleted

## 2012-02-27 ENCOUNTER — Emergency Department (HOSPITAL_COMMUNITY)
Admission: EM | Admit: 2012-02-27 | Discharge: 2012-02-27 | Disposition: A | Discharge status: Home / Self Care | Payer: Self-pay | Attending: Emergency Medicine | Admitting: Emergency Medicine

## 2012-02-27 ENCOUNTER — Emergency Department (HOSPITAL_COMMUNITY): Payer: Self-pay

## 2012-02-27 DIAGNOSIS — Z79899 Other long term (current) drug therapy: Secondary | ICD-10-CM | POA: Insufficient documentation

## 2012-02-27 DIAGNOSIS — R079 Chest pain, unspecified: Secondary | ICD-10-CM | POA: Insufficient documentation

## 2012-02-27 DIAGNOSIS — R1030 Lower abdominal pain, unspecified: Secondary | ICD-10-CM

## 2012-02-27 DIAGNOSIS — Z8659 Personal history of other mental and behavioral disorders: Secondary | ICD-10-CM | POA: Insufficient documentation

## 2012-02-27 DIAGNOSIS — F17200 Nicotine dependence, unspecified, uncomplicated: Secondary | ICD-10-CM | POA: Insufficient documentation

## 2012-02-27 DIAGNOSIS — K4090 Unilateral inguinal hernia, without obstruction or gangrene, not specified as recurrent: Secondary | ICD-10-CM | POA: Insufficient documentation

## 2012-02-27 DIAGNOSIS — Z8719 Personal history of other diseases of the digestive system: Secondary | ICD-10-CM | POA: Insufficient documentation

## 2012-02-27 DIAGNOSIS — F1910 Other psychoactive substance abuse, uncomplicated: Secondary | ICD-10-CM | POA: Insufficient documentation

## 2012-02-27 DIAGNOSIS — Z9889 Other specified postprocedural states: Secondary | ICD-10-CM | POA: Insufficient documentation

## 2012-02-27 DIAGNOSIS — M549 Dorsalgia, unspecified: Secondary | ICD-10-CM | POA: Insufficient documentation

## 2012-02-27 DIAGNOSIS — K921 Melena: Secondary | ICD-10-CM | POA: Insufficient documentation

## 2012-02-27 DIAGNOSIS — G8929 Other chronic pain: Secondary | ICD-10-CM | POA: Insufficient documentation

## 2012-02-27 LAB — BASIC METABOLIC PANEL
BUN: 15 mg/dL (ref 6–23)
Calcium: 9.2 mg/dL (ref 8.4–10.5)
GFR calc Af Amer: >90 mL/min (ref >90)
GFR calc non Af Amer: >90 mL/min (ref >90)
Potassium: 4.6 mEq/L (ref 3.5–5.1)

## 2012-02-27 LAB — CBC WITH DIFFERENTIAL/PLATELET
Basophils Relative: 0 % (ref 0–1)
Eosinophils Absolute: 0.3 10*3/uL (ref 0.0–0.7)
Hemoglobin: 14.7 g/dL (ref 13.0–17.0)
MCH: 30.6 pg (ref 26.0–34.0)
MCHC: 33.7 g/dL (ref 30.0–36.0)
Monocytes Relative: 5 % (ref 3–12)
Neutrophils Relative %: 68 % (ref 43–77)
Platelets: 269 10*3/uL (ref 150–400)

## 2012-02-27 MED ORDER — OXYCODONE-ACETAMINOPHEN 5-325 MG PO TABS: 1.0000 | ORAL_TABLET | Freq: Four times a day (QID) | ORAL | Status: DC | PRN

## 2012-02-27 MED ORDER — HYDROMORPHONE HCL PF 1 MG/ML IJ SOLN
1.0000 mg | Freq: Once | INTRAMUSCULAR | Status: AC
  Administered 2012-02-27: 1 mg via INTRAVENOUS
  Filled 2012-02-27: qty 1

## 2012-02-27 MED ORDER — ONDANSETRON HCL 4 MG/2ML IJ SOLN
4.0000 mg | Freq: Once | INTRAMUSCULAR | Status: AC
  Administered 2012-02-27: 4 mg via INTRAVENOUS
  Filled 2012-02-27: qty 2

## 2012-02-27 MED ORDER — ALUM & MAG HYDROXIDE-SIMETH 200-200-20 MG/5ML PO SUSP
30.0000 mL | Freq: Once | ORAL | Status: AC
  Administered 2012-02-27: 30 mL via ORAL
  Filled 2012-02-27: qty 30

## 2012-02-27 MED ORDER — SODIUM CHLORIDE 0.9 % IV BOLUS (SEPSIS)
250.0000 mL | Freq: Once | INTRAVENOUS | Status: AC
  Administered 2012-02-27: 250 mL via INTRAVENOUS

## 2012-02-27 MED ORDER — IOHEXOL 300 MG/ML  SOLN
50.0000 mL | Freq: Once | INTRAMUSCULAR | Status: AC | PRN
  Administered 2012-02-27: 50 mL via ORAL

## 2012-02-27 MED ORDER — SODIUM CHLORIDE 0.9 % IV SOLN
INTRAVENOUS | Status: DC
  Administered 2012-02-27: 16:00:00 via INTRAVENOUS

## 2012-02-27 MED ORDER — SODIUM CHLORIDE 0.9 % IV SOLN: INTRAVENOUS | Status: DC

## 2012-02-27 MED ORDER — IOHEXOL 300 MG/ML  SOLN
100.0000 mL | Freq: Once | INTRAMUSCULAR | Status: AC | PRN
  Administered 2012-02-27: 100 mL via INTRAVENOUS

## 2012-02-27 NOTE — ED Notes (Addendum)
Pt states he picked up a 5gallon bucket and now has inguinal hernia to the left which has previously been repaired. States "feels like something is torn in my abdomen." Pain x 2 days. Hernia x 6 mo., saving money to get it repaired. Per pt.

## 2012-02-27 NOTE — ED Notes (Signed)
Patient wants pain medicine. Patient states the MD told him he would order. RN made aware.

## 2012-02-27 NOTE — ED Notes (Signed)
Patient called out and asked for more pain medicine states that the medicine given to him did not help the pain.

## 2012-02-27 NOTE — ED Provider Notes (Signed)
History    This chart was scribed for Shelda Jakes, MD by Sofie Rower, ED Scribe. The patient was seen in room APA14/AP[facility]14 and the patient's care was started at 12:07PM.     CSN: [id number]  Arrival date & time 02/27/12  1112   First MD Initiated Contact with Patient 02/27/12 1207      Chief Complaint  Patient presents with  . Hernia  . Abdominal Pain    (Consider location/radiation/quality/duration/timing/severity/associated sxs/prior treatment) Patient is a 42 y.o. male presenting with abdominal pain. The history is provided by the patient. No language interpreter was used.  Abdominal Pain Pain location:  Suprapubic Pain quality: aching   Pain radiates to:  Groin Pain severity:  Moderate Onset quality:  Sudden Timing:  Constant Progression:  Worsening Chronicity:  New Relieved by:  Nothing Worsened by:  Position changes and movement Ineffective treatments:  None tried Associated symptoms: chest pain   Associated symptoms: no chills, no cough, no dysuria, no fever, no nausea and no vomiting     [name] is a 42 y.o. male , with a hx of right inguinal hernia repair, who presents to the Emergency Department complaining of sudden, progressively worsening, abdominal pain, located at the bilateral lower abdomen, radiating downwards towards the left groin, onset two days ago (02/25/12).  Associated symptoms include chest pain and hematochezia. The pt reports he is experiencing a lower abdominal pain, which he belvies he may have generated by lifting a heavy bucket two days ago. Modifying factors include certain movements and positions which intensify the abdominal pain.  The pt denies headache, neck pain, back pain, cough, nausea, vomiting, dysuria, myalgias, fevers, chills, and rash. Furthremore, the pt denies any hx of bleeding easily.   The pt is a current everyday smoker, in addition to drinking alcohol.     Past Medical History  Diagnosis Date  . Other, mixed, or  unspecified nondependent drug abuse, unspecified   . Personal history of unspecified digestive disease   . Abdominal pain, unspecified site   . Bipolar disorder, unspecified   . Backache, unspecified   . Tobacco use disorder   . Crohn's   . Chronic back pain     Past Surgical History  Procedure Laterality Date  . Right inguinal hernia repair    . Hernia repair      Family History  Problem Relation Age of Onset  . Lung cancer Father   . Cancer Father     lung  . Heart disease Mother     History  Substance Use Topics  . Smoking status: Current Every Day Smoker -- 1.00 packs/day    Types: Cigarettes  . Smokeless tobacco: Not on file  . Alcohol Use: Yes     Comment: daily  12/13, says no etoh for 1 year      Review of Systems  Constitutional: Negative for fever and chills.  HENT: Negative for neck pain.   Respiratory: Negative for cough.   Cardiovascular: Positive for chest pain.  Gastrointestinal: Positive for abdominal pain and blood in stool. Negative for nausea and vomiting.  Genitourinary: Negative for dysuria.  Musculoskeletal: Negative for myalgias and back pain.  Skin: Negative for rash.  Neurological: Negative for headaches.  All other systems reviewed and are negative.    Allergies  Fluoxetine hcl; Ketorolac tromethamine; Nsaids; and Ultram  Home Medications   Current Outpatient Rx  Name  Route  Sig  Dispense  Refill  . albuterol (PROVENTIL HFA) 108 (90  BASE) MCG/ACT inhaler   Inhalation   Inhale 2 puffs into the lungs every 6 (six) hours as needed. for shortness of breath         . oxyCODONE-acetaminophen (PERCOCET/ROXICET) 5-325 MG per tablet   Oral   Take 1-2 tablets by mouth every 6 (six) hours as needed for pain.   20 tablet   0   . oxyCODONE-acetaminophen (PERCOCET/ROXICET) 5-325 MG per tablet   Oral   Take 1-2 tablets by mouth every 6 (six) hours as needed for pain.   15 tablet   0     BP 129/87  Pulse 94  Temp(Src) 97.9 F  (36.6 C) (Oral)  Resp 16  Ht 6' (1.829 m)  Wt 170 lb (77.111 kg)  BMI 23.05 kg/m2  SpO2 100%  Physical Exam  Nursing note and vitals reviewed. Constitutional: He is oriented to person, place, and time. He appears well-developed and well-nourished. No distress.  HENT:  Head: Normocephalic and atraumatic.  Mouth/Throat: Oropharynx is clear and moist.  Eyes: Conjunctivae and EOM are normal. Pupils are equal, round, and reactive to light. No scleral icterus.  Neck: Neck supple. No tracheal deviation present.  Cardiovascular: Normal rate, regular rhythm and normal heart sounds.   No murmur heard. Pulmonary/Chest: Effort normal and breath sounds normal. No respiratory distress. He has no wheezes.  Abdominal: Soft. Bowel sounds are normal. He exhibits no distension.  Genitourinary:  Well healed right groin incision. No selling. No tenderness. No swelling in the testicle.   Musculoskeletal: Normal range of motion. He exhibits no edema.  Neurological: He is alert and oriented to person, place, and time. No cranial nerve deficit or sensory deficit.  Skin: Skin is warm and dry.  Psychiatric: He has a normal mood and affect. His behavior is normal.    ED Course  Procedures (including critical care time)  DIAGNOSTIC STUDIES: Oxygen Saturation is 100% on room air, normal by my interpretation.    COORDINATION OF CARE:  12:47 PM- Treatment plan discussed with patient. Pt agrees with treatment.      Labs Reviewed  CBC WITH DIFFERENTIAL  BASIC METABOLIC PANEL   Ct Abdomen Pelvis W Contrast  02/27/2012  *RADIOLOGY REPORT*  Clinical Data: Abdominal pain, history of hernia repair  CT ABDOMEN AND PELVIS WITH CONTRAST  Technique:  Multidetector CT imaging of the abdomen and pelvis was performed following the standard protocol during bolus administration of intravenous contrast.  Contrast: 50mL OMNIPAQUE IOHEXOL 300 MG/ML  SOLN, OMNIPAQUE IOHEXOL 300 MG/ML  SOLN  Comparison: CT abdomen  pelvis of 07/02/2011.  Findings: The lung bases are clear.  There may be a small hiatal hernia present versus reflux esophagitis with some thickening of the mucosa in the region of the distal esophagus.  The liver enhances with no focal abnormality and no ductal dilatation is seen.  There may be mild fatty infiltration of the liver with some sparing near the gallbladder.  No calcified gallstones are seen. The pancreas is normal in size and the pancreatic duct is not dilated.  The adrenal glands and spleen are unremarkable.  The stomach is not well distended.  The kidneys enhance with no calculus or mass.  There are several small cysts present but no solid renal lesion is seen.  The abdominal aorta is normal in caliber.  No adenopathy is seen.  A few small retroperitoneal nodes are present.  The urinary bladder is not well distended but is unremarkable.  The prostate is normal in size.  No fluid is seen within the pelvis. The colon is largely decompressed but no colonic lesion is evident. The terminal ileum and the appendix are unremarkable.  No inguinal hernia is seen.  No abdominal wall hernia is noted.  No skeletal abnormality is seen.  IMPRESSION:  1.  No evidence of inguinal or abdominal wall hernia is seen. 2.  Cannot exclude small hiatal hernia versus reflux esophagitis with somewhat thickened mucosa of the distal esophagus. 3.  The appendix and the terminal ileum are unremarkable.   Original Report Authenticated By: Dwyane Dee, M.D.    Results for orders placed during the hospital encounter of 02/27/12  CBC WITH DIFFERENTIAL      Result Value Range   WBC 9.3  4.0 - 10.5 K/uL   RBC 4.81  4.22 - 5.81 MIL/uL   Hemoglobin 14.7  13.0 - 17.0 g/dL   HCT 16.1  09.6 - 04.5 %   MCV 90.6  78.0 - 100.0 fL   MCH 30.6  26.0 - 34.0 pg   MCHC 33.7  30.0 - 36.0 g/dL   RDW 40.9  81.1 - 91.4 %   Platelets 269  150 - 400 K/uL   Neutrophils Relative 68  43 - 77 %   Neutro Abs 6.3  1.7 - 7.7 K/uL   Lymphocytes  Relative 23  12 - 46 %   Lymphs Abs 2.1  0.7 - 4.0 K/uL   Monocytes Relative 5  3 - 12 %   Monocytes Absolute 0.5  0.1 - 1.0 K/uL   Eosinophils Relative 3  0 - 5 %   Eosinophils Absolute 0.3  0.0 - 0.7 K/uL   Basophils Relative 0  0 - 1 %   Basophils Absolute 0.0  0.0 - 0.1 K/uL  BASIC METABOLIC PANEL      Result Value Range   Sodium 137  135 - 145 mEq/L   Potassium 4.6  3.5 - 5.1 mEq/L   Chloride 105  96 - 112 mEq/L   CO2 24  19 - 32 mEq/L   Glucose, Bld 88  70 - 99 mg/dL   BUN 15  6 - 23 mg/dL   Creatinine, Ser 7.82  0.50 - 1.35 mg/dL   Calcium 9.2  8.4 - 95.6 mg/dL   GFR calc non Af Amer >90  >90 mL/min   GFR calc Af Amer >90  >90 mL/min     1. Groin pain   2. Hernia, inguinal, left       MDM  CT scan without evidence of incarcerated inguinal hernia. By history patient has listed the hernia in the past. Unable to confirm that today. Patient's labs without significant abnormality no leukocytosis no significant anemia no actual abnormalities. Followup with general surgery will be important referral given again today. Patient told that the ED will not daily continue providing pain medication for this chronic condition. Needs to find a primary care Dr. Or  followup with general surgery.      I personally performed the services described in this documentation, which was scribed in my presence. The recorded information has been reviewed and is accurate.   Shelda Jakes, MD 02/27/12 [phone]

## 2012-03-04 NOTE — H&P (Signed)
  NTS SOAP Note  Vital Signs:  Vitals as of: 03/04/2012: Systolic 144: Diastolic 86: Heart Rate 64: Temp 98.74F: Height 52ft 0in: Weight 176Lbs 0 Ounces: Pain Level 6: BMI 24  BMI : 23.87 kg/m2  Subjective: This 42 Years old Male presents for of    HERNIA: ,Has developed pain and swellling in the left groin region over the past month.  Made worse with straining.  Told he had a hernia by the ER.  Review of Symptoms:  Constitutional:unremarkable   Head:unremarkable    Eyes:  blurred vision bilateral Nose/Mouth/Throat:unremarkable Cardiovascular:  unremarkable   Respiratory:  dyspnea Gastrointestin    nausea,heartburn Genitourinary:unremarkable       joint and back pain Skin:unremarkable Hematolgic/Lymphatic:unremarkable     Allergic/Immunologic:unremarkable     Past Medical History:    Reviewed   Past Medical History  Surgical History: right inguinal herniorrhaphy Medical Problems: none Allergies: nkda Medications: oxycodone, omeprazole   Social History:Reviewed  Social History  Preferred Language: English Ethnicity: Not Hispanic / Latino Age: 70 Years 1 Months Marital Status:  S Alcohol: unknown Recreational drug(s): unknown   Smoking Status: Light tobacoo smoker reviewed on 01/31/2012 Started Date: 01/09/1988 Packs per day: 0.50 Functional Status reviewed on mm/dd/yyyy ------------------------------------------------ Bathing: Normal Cooking: Normal Dressing: Normal Driving: Normal Eating: Normal Managing Meds: Normal Oral Care: Normal Shopping: Normal Toileting: Normal Transferring: Normal Walking: Normal Cognitive Status reviewed on mm/dd/yyyy ------------------------------------------------ Attention: Normal Decision Making: Normal Language: Normal Memory: Normal Motor: Normal Perception: Normal Problem Solving: Normal Visual and Spatial: Normal   Family History:  Reviewed   Family History  Is  there a family history of:IBS    Objective Information: General:  Well appearing, well nourished in no distress. Heart:  RRR, no murmur Lungs:    CTA bilaterally, no wheezes, rhonchi, rales.  Breathing unlabored. Abdomen:Soft, NT/ND, no HSM, no masses.  Reducible left inguinal hernia noted. ZO:[id number]    Assessment:Left inguinal hernia  Diagnosis &amp; Procedure:    Plan:Will schedule for left inguinal hernia once patient gets financial situation arranged.   Patient Education:Alternative treatments to surgery were discussed with patient (and family).  Risks and benefits  of procedure including bleeding, infection, and recurrence of the hernia were fully explained to the patient (and family) who gave informed consent. Patient/family questions were addressed.  Follow-up:Pending Surgery

## 2012-03-11 NOTE — Patient Instructions (Addendum)
[name]  03/11/2012   Your procedure is scheduled on:  03/17/2012  Report to [facility] at  615  AM.  Call this number if you have problems the morning of surgery: [phone]   Remember:   Do not eat food or drink liquids after midnight.   Take these medicines the morning of surgery with A SIP OF WATER: oxycodone. Take albuterol morning of surgery.   Do not wear jewelry, make-up or nail polish.  Do not wear lotions, powders, or perfumes.   Do not shave 48 hours prior to surgery. Men may shave face and neck.  Do not bring valuables to the hospital.  Contacts, dentures or bridgework may not be worn into surgery.  Leave suitcase in the car. After surgery it may be brought to your room.  For patients admitted to the hospital, checkout time is 11:00 AM the day of discharge.   Patients discharged the day of surgery will not be allowed to drive  home.  Name and phone number of your driver: family  Special Instructions: Shower using CHG 2 nights before surgery and the night before surgery.  If you shower the day of surgery use CHG.  Use special wash - you have one bottle of CHG for all showers.  You should use approximately 1/3 of the bottle for each shower.   Please read over the following fact sheets that you were given: Pain Booklet, Coughing and Deep Breathing, MRSA Information, Surgical Site Infection Prevention, Anesthesia Post-op Instructions and Care and Recovery After Surgery Hernia A hernia occurs when an internal organ pushes out through a weak spot in the abdominal wall. Hernias most commonly occur in the groin and around the navel. Hernias often can be pushed back into place (reduced). Most hernias tend to get worse over time. Some abdominal hernias can get stuck in the opening (irreducible or incarcerated hernia) and cannot be reduced. An irreducible abdominal hernia which is tightly squeezed into the opening is at risk for impaired blood supply (strangulated hernia). A  strangulated hernia is a medical emergency. Because of the risk for an irreducible or strangulated hernia, surgery may be recommended to repair a hernia. CAUSES   Heavy lifting.  Prolonged coughing.  Straining to have a bowel movement.  A cut (incision) made during an abdominal surgery. HOME CARE INSTRUCTIONS   Bed rest is not required. You may continue your normal activities.  Avoid lifting more than 10 pounds (4.5 kg) or straining.  Cough gently. If you are a smoker it is best to stop. Even the best hernia repair can break down with the continual strain of coughing. Even if you do not have your hernia repaired, a cough will continue to aggravate the problem.  Do not wear anything tight over your hernia. Do not try to keep it in with an outside bandage or truss. These can damage abdominal contents if they are trapped within the hernia sac.  Eat a normal diet.  Avoid constipation. Straining over long periods of time will increase hernia size and encourage breakdown of repairs. If you cannot do this with diet alone, stool softeners may be used. SEEK IMMEDIATE MEDICAL CARE IF:   You have a fever.  You develop increasing abdominal pain.  You feel nauseous or vomit.  Your hernia is stuck outside the abdomen, looks discolored, feels hard, or is tender.  You have any changes in your bowel habits or in the hernia that are unusual for you.  You have increased pain or swelling around the hernia.  You cannot push the hernia back in place by applying gentle pressure while lying down. MAKE SURE YOU:   Understand these instructions.  Will watch your condition.  Will get help right away if you are not doing well or get worse. Document Released: 12/25/2004 Document Revised: 03/19/2011 Document Reviewed: 08/14/2007 [facility] Patient Information 2013 [place], [place]. PATIENT INSTRUCTIONS POST-ANESTHESIA  IMMEDIATELY FOLLOWING SURGERY:  Do not drive or operate machinery for the first  twenty four hours after surgery.  Do not make any important decisions for twenty four hours after surgery or while taking narcotic pain medications or sedatives.  If you develop intractable nausea and vomiting or a severe headache please notify your doctor immediately.  FOLLOW-UP:  Please make an appointment with your surgeon as instructed. You do not need to follow up with anesthesia unless specifically instructed to do so.  WOUND CARE INSTRUCTIONS (if applicable):  Keep a dry clean dressing on the anesthesia/puncture wound site if there is drainage.  Once the wound has quit draining you may leave it open to air.  Generally you should leave the bandage intact for twenty four hours unless there is drainage.  If the epidural site drains for more than 36-48 hours please call the anesthesia department.  QUESTIONS?:  Please feel free to call your physician or the hospital operator if you have any questions, and they will be happy to assist you.

## 2012-03-12 ENCOUNTER — Inpatient Hospital Stay (HOSPITAL_COMMUNITY)
Admission: RE | Admit: 2012-03-12 | Discharge: 2012-03-12 | Disposition: A | Discharge status: Home / Self Care | Payer: Self-pay | Source: Ambulatory Visit

## 2012-03-17 ENCOUNTER — Encounter (HOSPITAL_COMMUNITY): Admission: RE | Payer: Self-pay | Source: Ambulatory Visit

## 2012-03-17 ENCOUNTER — Ambulatory Visit (HOSPITAL_COMMUNITY): Admission: RE | Admit: 2012-03-17 | Payer: Self-pay | Source: Ambulatory Visit | Admitting: General Surgery

## 2012-03-17 SURGERY — REPAIR, HERNIA, INGUINAL, ADULT
Anesthesia: Choice | Laterality: Left

## 2012-03-28 ENCOUNTER — Encounter (HOSPITAL_COMMUNITY): Payer: Self-pay | Admitting: Pharmacy Technician

## 2012-03-29 ENCOUNTER — Emergency Department (HOSPITAL_COMMUNITY)
Admission: EM | Admit: 2012-03-29 | Discharge: 2012-03-30 | Disposition: A | Discharge status: Home / Self Care | Payer: Self-pay | Attending: Emergency Medicine | Admitting: Emergency Medicine

## 2012-03-29 ENCOUNTER — Encounter (HOSPITAL_COMMUNITY): Payer: Self-pay | Admitting: *Deleted

## 2012-03-29 DIAGNOSIS — Z8719 Personal history of other diseases of the digestive system: Secondary | ICD-10-CM | POA: Insufficient documentation

## 2012-03-29 DIAGNOSIS — Z9889 Other specified postprocedural states: Secondary | ICD-10-CM | POA: Insufficient documentation

## 2012-03-29 DIAGNOSIS — Z79899 Other long term (current) drug therapy: Secondary | ICD-10-CM | POA: Insufficient documentation

## 2012-03-29 DIAGNOSIS — F1210 Cannabis abuse, uncomplicated: Secondary | ICD-10-CM | POA: Insufficient documentation

## 2012-03-29 DIAGNOSIS — K4090 Unilateral inguinal hernia, without obstruction or gangrene, not specified as recurrent: Secondary | ICD-10-CM | POA: Insufficient documentation

## 2012-03-29 DIAGNOSIS — F319 Bipolar disorder, unspecified: Secondary | ICD-10-CM | POA: Insufficient documentation

## 2012-03-29 DIAGNOSIS — F17200 Nicotine dependence, unspecified, uncomplicated: Secondary | ICD-10-CM | POA: Insufficient documentation

## 2012-03-29 DIAGNOSIS — M549 Dorsalgia, unspecified: Secondary | ICD-10-CM | POA: Insufficient documentation

## 2012-03-29 DIAGNOSIS — G8929 Other chronic pain: Secondary | ICD-10-CM | POA: Insufficient documentation

## 2012-03-29 MED ORDER — HYDROXYZINE HCL 50 MG/ML IM SOLN: 25.0000 mg | Freq: Once | INTRAMUSCULAR | Status: DC

## 2012-03-29 MED ORDER — PROMETHAZINE HCL 25 MG PO TABS: 25.0000 mg | ORAL_TABLET | Freq: Four times a day (QID) | ORAL | Status: AC | PRN

## 2012-03-29 MED ORDER — CELECOXIB 50 MG PO CAPS: 50.0000 mg | ORAL_CAPSULE | Freq: Two times a day (BID) | ORAL | Status: DC

## 2012-03-29 MED ORDER — HYDROXYZINE HCL 25 MG PO TABS: 25.0000 mg | ORAL_TABLET | Freq: Three times a day (TID) | ORAL | Status: DC | PRN

## 2012-03-29 NOTE — ED Notes (Signed)
Pt came to desk and states he does not want to wait for a doctor to see him and he accepts that fact that if he leaves he is leaving against medical advice and is aware that his condition could worsen or even cause death. Pt accepts this and signs AMA form

## 2012-03-29 NOTE — ED Notes (Signed)
Pt states he has a hernia in his groin area. Pt can't exactly tell me where it is. Pt states he is supposed to have surgery on it on March 28th, 2014.

## 2012-03-29 NOTE — ED Notes (Signed)
Pt now still in waiting room, states he decided to stay to be seen.

## 2012-03-30 NOTE — ED Provider Notes (Signed)
History     CSN: [id number]  Arrival date & time 03/29/12  2202   First MD Initiated Contact with Patient 03/29/12 2349      Chief Complaint  Patient presents with  . Groin Pain    (Consider location/radiation/quality/duration/timing/severity/associated sxs/prior treatment) HPI History provided by the patient. Has a known left inguinal hernia. Is scheduled for surgical repair in 5 days.  Tonight patient states he is stressed out, and worried about his bipolar, has been doing an increased amount of physical activity in order to get ready for surgery, and is having worsening hernia pain.  Pain is sharp in quality and not radiating from the left inguinal region.  He has bulging in that area, but denies any difficulty with reducing it. Patient states she does not see a psychiatrist because he prefers to come to the emergency room to receive Valium for his bipolar disorder.  He is also requesting narcotic pain medications specifically and states his allergy to Ultram, Toradol and NSAIDs his nausea and vomiting.  Pain is mild to moderate severity.   Past Medical History  Diagnosis Date  . Other, mixed, or unspecified nondependent drug abuse, unspecified   . Personal history of unspecified digestive disease   . Abdominal pain, unspecified site   . Bipolar disorder, unspecified   . Backache, unspecified   . Tobacco use disorder   . Crohn's   . Chronic back pain     Past Surgical History  Procedure Laterality Date  . Right inguinal hernia repair    . Hernia repair      Family History  Problem Relation Age of Onset  . Lung cancer Father   . Cancer Father     lung  . Heart disease Mother     History  Substance Use Topics  . Smoking status: Current Every Day Smoker -- 1.00 packs/day    Types: Cigarettes  . Smokeless tobacco: Not on file  . Alcohol Use: Yes      Review of Systems  Constitutional: Negative for fever and chills.  HENT: Negative for neck pain and neck  stiffness.   Eyes: Negative for pain.  Respiratory: Negative for shortness of breath.   Cardiovascular: Negative for chest pain.  Gastrointestinal: Negative for constipation and blood in stool.  Genitourinary: Negative for dysuria, hematuria and flank pain.  Musculoskeletal: Negative for back pain.  Skin: Negative for rash.  Neurological: Negative for headaches.  All other systems reviewed and are negative.    Allergies  Fluoxetine hcl; Ketorolac tromethamine; Nsaids; and Ultram  Home Medications   Current Outpatient Rx  Name  Route  Sig  Dispense  Refill  . albuterol (PROVENTIL HFA) 108 (90 BASE) MCG/ACT inhaler   Inhalation   Inhale 2 puffs into the lungs every 6 (six) hours as needed. for shortness of breath         . celecoxib (CELEBREX) 50 MG capsule   Oral   Take 1 capsule (50 mg total) by mouth 2 (two) times daily.   15 capsule   0   . hydrOXYzine (ATARAX/VISTARIL) 25 MG tablet   Oral   Take 1 tablet (25 mg total) by mouth every 8 (eight) hours as needed for anxiety.   12 tablet   0   . promethazine (PHENERGAN) 25 MG tablet   Oral   Take 1 tablet (25 mg total) by mouth every 6 (six) hours as needed for nausea.   30 tablet   0     BP  161/92  Pulse 86  Temp(Src) 98 F (36.7 C) (Oral)  Resp 18  Ht 6\' 1"  (1.854 m)  Wt 170 lb (77.111 kg)  BMI 22.43 kg/m2  SpO2 99%  Physical Exam  Constitutional: He is oriented to person, place, and time. He appears well-developed and well-nourished.  HENT:  Head: Normocephalic and atraumatic.  Eyes: EOM are normal. Pupils are equal, round, and reactive to light.  Neck: Neck supple.  Cardiovascular: Regular rhythm and intact distal pulses.   Pulmonary/Chest: Effort normal. No respiratory distress.  Abdominal: Soft. Bowel sounds are normal. He exhibits no distension. There is no tenderness.  Genitourinary:  Left inguinal region with easily reducible hernia, mild tenderness to palpation without acute abdomen.    Musculoskeletal: Normal range of motion. He exhibits no edema.  Neurological: He is alert and oriented to person, place, and time.  Skin: Skin is warm and dry.  Psychiatric: He has a normal mood and affect. Thought content normal.    ED Course  Procedures (including critical care time)    1. Left inguinal hernia     Vistaril provided for bipolar symptoms. Nonnarcotic prescription provided. Plan keep scheduled surgery with Dr. Lovell Sheehan for hernia repair. Hernia precautions provided  MDM   Easily reducible left inguinal hernia. 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 Patient declines referrals for narcotic addiction.  Old records, vital signs and nursing notes reviewed         Sunnie Nielsen, MD 03/30/12 [phone]

## 2012-03-30 NOTE — ED Notes (Signed)
Patient refused to take ordered vistaril before going home. Threw discharge paperwork and stated he did not need the prescriptions. Patient stated I could go to a crack house and get something better.

## 2012-03-31 ENCOUNTER — Inpatient Hospital Stay (HOSPITAL_COMMUNITY)
Admission: RE | Admit: 2012-03-31 | Discharge: 2012-03-31 | Disposition: A | Discharge status: Home / Self Care | Payer: Self-pay | Source: Ambulatory Visit

## 2012-03-31 NOTE — Patient Instructions (Addendum)
DAIDEN COLTRANE  03/31/2012   Your procedure is scheduled on:  04/04/2012  Report to [facility] at  845  AM.  Call this number if you have problems the morning of surgery: [phone]   Remember:   Do not eat food or drink liquids after midnight.   Take these medicines the morning of surgery with A SIP OF WATER:  Atarax,phenergan. Take proventil inhaler   Do not wear jewelry, make-up or nail polish.  Do not wear lotions, powders, or perfumes.   Do not shave 48 hours prior to surgery. Men may shave face and neck.  Do not bring valuables to the hospital.  Contacts, dentures or bridgework may not be worn into surgery.  Leave suitcase in the car. After surgery it may be brought to your room.  For patients admitted to the hospital, checkout time is 11:00 AM the day of discharge.   Patients discharged the day of surgery will not be allowed to drive  home.  Name and phone number of your driver: family  Special Instructions: Shower using CHG 2 nights before surgery and the night before surgery.  If you shower the day of surgery use CHG.  Use special wash - you have one bottle of CHG for all showers.  You should use approximately 1/3 of the bottle for each shower.   Please read over the following fact sheets that you were given: Pain Booklet, Coughing and Deep Breathing, MRSA Information, Surgical Site Infection Prevention, Anesthesia Post-op Instructions and Care and Recovery After Surgery Hernia, Surgical Repair A hernia occurs when an internal organ pushes out through a weak spot in the belly (abdominal) wall muscles. Hernias commonly occur in the groin and around the navel. Hernias often can be pushed back into place (reduced). Most hernias tend to get worse over time. Problems occur when abdominal contents get stuck in the opening (incarcerated hernia). The blood supply gets cut off (strangulated hernia). This is an emergency and needs surgery. Otherwise, hernia repair can be an elective  procedure. This means you can schedule this at your convenience when an emergency is not present. Because complications can occur, if you decide to repair the hernia, it is best to do it soon. When it becomes an emergency procedure, there is increased risk of complications after surgery. CAUSES   Heavy lifting.  Obesity.  Prolonged coughing.  Straining to move your bowels.  Hernias can also occur through a cut (incision) by a surgeonafter an abdominal operation. HOME CARE INSTRUCTIONS Before the repair:  Bed rest is not required. You may continue your normal activities, but avoid heavy lifting (more than 10 pounds) or straining. Cough gently. If you are a smoker, it is best to stop. Even the best hernia repair can break down with the continual strain of coughing.  Do not wear anything tight over your hernia. Do not try to keep it in with an outside bandage or truss. These can damage abdominal contents if they are trapped in the hernia sac.  Eat a normal diet. Avoid constipation. Straining over long periods of time to have a bowel movement will increase hernia size. It also can breakdown repairs. If you cannot do this with diet alone, laxatives or stool softeners may be used. PRIOR TO SURGERY, SEEK IMMEDIATE MEDICAL CARE IF: You have problems (symptoms) of a trapped (incarcerated) hernia. Symptoms include:  An oral temperature above 102 F (38.9 C) develops, or as your caregiver suggests.  Increasing abdominal pain.  Feeling sick to your stomach(nausea) and vomiting.  You stop passing gas or stool.  The hernia is stuck outside the abdomen, looks discolored, feels hard, or is tender.  You have any changes in your bowel habits or in the hernia that is unusual for you. LET YOUR CAREGIVERS KNOW ABOUT THE FOLLOWING:  Allergies.  Medications taken including herbs, eye drops, over the counter medications, and creams.  Use of steroids (by mouth or creams).  Family or personal  history of problems with anesthetics or Novocaine.  Possibility of pregnancy, if this applies.  Personal history of blood clots (thrombophlebitis).  Family or personal history of bleeding or blood problems.  Previous surgery.  Other health problems. BEFORE THE PROCEDURE You should be present 1 hour prior to your procedure, or as directed by your caregiver.  AFTER THE PROCEDURE After surgery, you will be taken to the recovery area. A nurse will watch and check your progress there. Once you are awake, stable, and taking fluids well, you will be allowed to go home as long as there are no problems. Once home, an ice pack (wrapped in a light towel) applied to your operative site may help with discomfort. It may also keep the swelling down. Do not lift anything heavier than 10 pounds (4.55 kilograms). Take showers not baths. Do not drive while taking narcotics. Follow instructions as suggested by your caregiver.  SEEK IMMEDIATE MEDICAL CARE IF: After surgery:  There is redness, swelling, or increasing pain in the wound.  There is pus coming from the wound.  There is drainage from a wound lasting longer than 1 day.  An unexplained oral temperature above 102 F (38.9 C) develops.  You notice a foul smell coming from the wound or dressing.  There is a breaking open of a wound (edged not staying together) after the sutures have been removed.  You notice increasing pain in the shoulders (shoulder strap areas).  You develop dizzy episodes or fainting while standing.  You develop persistent nausea or vomiting.  You develop a rash.  You have difficulty breathing.  You develop any reaction or side effects to medications given. MAKE SURE YOU:   Understand these instructions.  Will watch your condition.  Will get help right away if you are not doing well or get worse. Document Released: 06/20/2000 Document Revised: 03/19/2011 Document Reviewed: 05/13/2007 [facility] Patient  Information 2013 [place], [place]. PATIENT INSTRUCTIONS POST-ANESTHESIA  IMMEDIATELY FOLLOWING SURGERY:  Do not drive or operate machinery for the first twenty four hours after surgery.  Do not make any important decisions for twenty four hours after surgery or while taking narcotic pain medications or sedatives.  If you develop intractable nausea and vomiting or a severe headache please notify your doctor immediately.  FOLLOW-UP:  Please make an appointment with your surgeon as instructed. You do not need to follow up with anesthesia unless specifically instructed to do so.  WOUND CARE INSTRUCTIONS (if applicable):  Keep a dry clean dressing on the anesthesia/puncture wound site if there is drainage.  Once the wound has quit draining you may leave it open to air.  Generally you should leave the bandage intact for twenty four hours unless there is drainage.  If the epidural site drains for more than 36-48 hours please call the anesthesia department.  QUESTIONS?:  Please feel free to call your physician or the hospital operator if you have any questions, and they will be happy to assist you.

## 2012-04-01 ENCOUNTER — Encounter (HOSPITAL_COMMUNITY)
Admission: RE | Admit: 2012-04-01 | Discharge: 2012-04-01 | Disposition: A | Discharge status: Home / Self Care | Payer: Self-pay | Source: Ambulatory Visit

## 2012-04-04 ENCOUNTER — Encounter (HOSPITAL_COMMUNITY): Admission: RE | Payer: Self-pay | Source: Ambulatory Visit

## 2012-04-04 ENCOUNTER — Ambulatory Visit (HOSPITAL_COMMUNITY): Admission: RE | Admit: 2012-04-04 | Payer: Self-pay | Source: Ambulatory Visit | Admitting: General Surgery

## 2012-04-04 SURGERY — REPAIR, HERNIA, INGUINAL, ADULT
Anesthesia: General | Laterality: Left

## 2012-06-19 NOTE — H&P (Signed)
  NTS SOAP Note  Vital Signs:  Vitals as of: 06/19/2012: Systolic 144: Diastolic 86: Heart Rate 64: Temp 98.45F: Height 19ft 0in: Weight 176Lbs 0 Ounces: Pain Level 6: BMI 24  BMI : 23.87 kg/m2  Subjective: This 84 Years 1 Months old Male presents for of    HERNIA: ,Has developed pain and swellling in the left groin region over the past month.  Made worse with straining.  Told he had a hernia by the ER.  Review of Symptoms:  Constitutional:unremarkable   Head:unremarkable    Eyes:  blurred vision bilateral Nose/Mouth/Throat:unremarkable Cardiovascular:  unremarkable   Respiratory:  dyspnea Gastrointestin    nausea,heartburn Genitourinary:unremarkable       joint and back pain Skin:unremarkable Hematolgic/Lymphatic:unremarkable     Allergic/Immunologic:unremarkable     Past Medical History:    Reviewed   Past Medical History  Surgical History: right inguinal herniorrhaphy Medical Problems: none Allergies: nkda Medications: oxycodone, omeprazole   Social History:Reviewed  Social History  Preferred Language: English Ethnicity: Not Hispanic / Latino Age: 57 Years 1 Months Marital Status:  S Alcohol: unknown Recreational drug(s): unknown   Smoking Status: Light tobacoo smoker reviewed on 01/31/2012 Started Date: 01/09/1988 Packs per day: 0.50 Functional Status reviewed on mm/dd/yyyy ------------------------------------------------ Bathing: Normal Cooking: Normal Dressing: Normal Driving: Normal Eating: Normal Managing Meds: Normal Oral Care: Normal Shopping: Normal Toileting: Normal Transferring: Normal Walking: Normal Cognitive Status reviewed on mm/dd/yyyy ------------------------------------------------ Attention: Normal Decision Making: Normal Language: Normal Memory: Normal Motor: Normal Perception: Normal Problem Solving: Normal Visual and Spatial: Normal   Family History:  Reviewed   Family  History  Is there a family history of:IBS    Objective Information: General:  Well appearing, well nourished in no distress. Heart:  RRR, no murmur Lungs:    CTA bilaterally, no wheezes, rhonchi, rales.  Breathing unlabored. Abdomen:Soft, NT/ND, no HSM, no masses.  Reducible left inguinal hernia noted. RU:[id number]    Assessment:Left inguinal hernia  Diagnosis &amp; Procedure:    Plan:Will schedule for left inguinal hernia once patient gets financial situation arranged.   Patient Education:Alternative treatments to surgery were discussed with patient (and family).  Risks and benefits  of procedure were fully explained to the patient (and family) who gave informed consent. Patient/family questions were addressed.  Follow-up:Pending Surgery

## 2012-06-20 NOTE — Patient Instructions (Signed)
[name]  06/20/2012   Your procedure is scheduled on:  06/27/12  Report to [facility] at [place] AM.  Call this number if you have problems the morning of surgery: [phone]   Remember:   Do not eat food or drink liquids after midnight.   Take these medicines the morning of surgery with A SIP OF WATER: albuterol   Do not wear jewelry, make-up or nail polish.  Do not wear lotions, powders, or perfumes. You may wear deodorant.  Do not shave 48 hours prior to surgery. Men may shave face and neck.  Do not bring valuables to the hospital.  [facility] is not responsible                   for any belongings or valuables.  Contacts, dentures or bridgework may not be worn into surgery.  Leave suitcase in the car. After surgery it may be brought to your room.  For patients admitted to the hospital, checkout time is 11:00 AM the day of  discharge.   Patients discharged the day of surgery will not be allowed to drive  home.  Name and phone number of your driver: family  Special Instructions: Shower using CHG 2 nights before surgery and the night before surgery.  If you shower the day of surgery use CHG.  Use special wash - you have one bottle of CHG for all showers.  You should use approximately 1/3 of the bottle for each shower.   Please read over the following fact sheets that you were given: Pain Booklet, Coughing and Deep Breathing, MRSA Information, Surgical Site Infection Prevention, Anesthesia Post-op Instructions and Care and Recovery After Surgery   PATIENT INSTRUCTIONS POST-ANESTHESIA  IMMEDIATELY FOLLOWING SURGERY:  Do not drive or operate machinery for the first twenty four hours after surgery.  Do not make any important decisions for twenty four hours after surgery or while taking narcotic pain medications or sedatives.  If you develop intractable nausea and vomiting or a severe headache please notify your doctor immediately.  FOLLOW-UP:  Please make an appointment with your surgeon  as instructed. You do not need to follow up with anesthesia unless specifically instructed to do so.  WOUND CARE INSTRUCTIONS (if applicable):  Keep a dry clean dressing on the anesthesia/puncture wound site if there is drainage.  Once the wound has quit draining you may leave it open to air.  Generally you should leave the bandage intact for twenty four hours unless there is drainage.  If the epidural site drains for more than 36-48 hours please call the anesthesia department.  QUESTIONS?:  Please feel free to call your physician or the hospital operator if you have any questions, and they will be happy to assist you.      Inguinal Hernia, Adult Muscles help keep everything in the body in its proper place. But if a weak spot in the muscles develops, something can poke through. That is called a hernia. When this happens in the lower part of the belly (abdomen), it is called an inguinal hernia. (It takes its name from a part of the body in this region called the inguinal canal.) A weak spot in the wall of muscles lets some fat or part of the small intestine bulge through. An inguinal hernia can develop at any age. Men get them more often than women. CAUSES  In adults, an inguinal hernia develops over time.  It can be triggered by:  Suddenly straining the  muscles of the lower abdomen.  Lifting heavy objects.  Straining to have a bowel movement. Difficult bowel movements (constipation) can lead to this.  Constant coughing. This may be caused by smoking or lung disease.  Being overweight.  Being pregnant.  Working at a job that requires long periods of standing or heavy lifting.  Having had an inguinal hernia before. One type can be an emergency situation. It is called a strangulated inguinal hernia. It develops if part of the small intestine slips through the weak spot and cannot get back into the abdomen. The blood supply can be cut off. If that happens, part of the intestine may die. This  situation requires emergency surgery. SYMPTOMS  Often, a small inguinal hernia has no symptoms. It is found when a healthcare provider does a physical exam. Larger hernias usually have symptoms.   In adults, symptoms may include:  A lump in the groin. This is easier to see when the person is standing. It might disappear when lying down.  In men, a lump in the scrotum.  Pain or burning in the groin. This occurs especially when lifting, straining or coughing.  A dull ache or feeling of pressure in the groin.  Signs of a strangulated hernia can include:  A bulge in the groin that becomes very painful and tender to the touch.  A bulge that turns red or purple.  Fever, nausea and vomiting.  Inability to have a bowel movement or to pass gas. DIAGNOSIS  To decide if you have an inguinal hernia, a healthcare provider will probably do a physical examination.  This will include asking questions about any symptoms you have noticed.  The healthcare provider might feel the groin area and ask you to cough. If an inguinal hernia is felt, the healthcare provider may try to slide it back into the abdomen.  Usually no other tests are needed. TREATMENT  Treatments can vary. The size of the hernia makes a difference. Options include:  Watchful waiting. This is often suggested if the hernia is small and you have had no symptoms.  No medical procedure will be done unless symptoms develop.  You will need to watch closely for symptoms. If any occur, contact your healthcare provider right away.  Surgery. This is used if the hernia is larger or you have symptoms.  Open surgery. This is usually an outpatient procedure (you will not stay overnight in a hospital). An cut (incision) is made through the skin in the groin. The hernia is put back inside the abdomen. The weak area in the muscles is then repaired by herniorrhaphy or hernioplasty. Herniorrhaphy: in this type of surgery, the weak muscles are  sewn back together. Hernioplasty: a patch or mesh is used to close the weak area in the abdominal wall.  Laparoscopy. In this procedure, a surgeon makes small incisions. A thin tube with a tiny video camera (called a laparoscope) is put into the abdomen. The surgeon repairs the hernia with mesh by looking with the video camera and using two long instruments. HOME CARE INSTRUCTIONS   After surgery to repair an inguinal hernia:  You will need to take pain medicine prescribed by your healthcare provider. Follow all directions carefully.  You will need to take care of the wound from the incision.  Your activity will be restricted for awhile. This will probably include no heavy lifting for several weeks. You also should not do anything too active for a few weeks. When you can return to  work will depend on the type of job that you have.  During "watchful waiting" periods, you should:  Maintain a healthy weight.  Eat a diet high in fiber (fruits, vegetables and whole grains).  Drink plenty of fluids to avoid constipation. This means drinking enough water and other liquids to keep your urine clear or pale yellow.  Do not lift heavy objects.  Do not stand for long periods of time.  Quit smoking. This should keep you from developing a frequent cough. SEEK MEDICAL CARE IF:   A bulge develops in your groin area.  You feel pain, a burning sensation or pressure in the groin. This might be worse if you are lifting or straining.  You develop a fever of more than 100.5 F (38.1 C). SEEK IMMEDIATE MEDICAL CARE IF:   Pain in the groin increases suddenly.  A bulge in the groin gets bigger suddenly and does not go down.  For men, there is sudden pain in the scrotum. Or, the size of the scrotum increases.  A bulge in the groin area becomes red or purple and is painful to touch.  You have nausea or vomiting that does not go away.  You feel your heart beating much faster than normal.  You  cannot have a bowel movement or pass gas.  You develop a fever of more than 102.0 F (38.9 C). Document Released: 05/13/2008 Document Revised: 03/19/2011 Document Reviewed: 05/13/2008 [facility] Patient Information 2014 [place], [place].

## 2012-06-23 ENCOUNTER — Encounter (HOSPITAL_COMMUNITY)
Admission: RE | Admit: 2012-06-23 | Discharge: 2012-06-23 | Disposition: A | Discharge status: Home / Self Care | Payer: Self-pay | Source: Ambulatory Visit

## 2012-06-23 NOTE — Progress Notes (Signed)
Spoke to Doctor Lovell Sheehan office about patient not coming to PAT appointment. I was told that he has canceled numerous times. I was also told that Dr. Lovell Sheehan told him if he canceled his PAT appointment the surgery would be canceled.Dr. Lovell Sheehan office to call patient, then let [name] know about scheduling.

## 2012-06-24 ENCOUNTER — Encounter (HOSPITAL_COMMUNITY)
Admission: RE | Admit: 2012-06-24 | Discharge: 2012-06-24 | Disposition: A | Discharge status: Home / Self Care | Payer: Self-pay | Source: Ambulatory Visit | Attending: General Surgery | Admitting: General Surgery

## 2012-06-24 ENCOUNTER — Encounter (HOSPITAL_COMMUNITY): Payer: Self-pay

## 2012-06-24 HISTORY — DX: Anxiety disorder, unspecified: F41.9

## 2012-06-24 HISTORY — DX: Shortness of breath: R06.02

## 2012-06-24 HISTORY — DX: Depression, unspecified: F32.A

## 2012-06-24 HISTORY — DX: Irritable bowel syndrome, unspecified: K58.9

## 2012-06-24 HISTORY — DX: Major depressive disorder, single episode, unspecified: F32.9

## 2012-06-24 HISTORY — DX: Gastro-esophageal reflux disease without esophagitis: K21.9

## 2012-06-24 LAB — CBC WITH DIFFERENTIAL/PLATELET
Basophils Absolute: 0 10*3/uL (ref 0.0–0.1)
Basophils Relative: 0 % (ref 0–1)
Eosinophils Absolute: 0.3 10*3/uL (ref 0.0–0.7)
Eosinophils Relative: 3 % (ref 0–5)
HCT: 47.3 % (ref 39.0–52.0)
Lymphocytes Relative: 25 % (ref 12–46)
MCHC: 34 g/dL (ref 30.0–36.0)
MCV: 91.3 fL (ref 78.0–100.0)
Monocytes Absolute: 0.3 10*3/uL (ref 0.1–1.0)
Platelets: 309 10*3/uL (ref 150–400)
RDW: 12.3 % (ref 11.5–15.5)
WBC: 7.6 10*3/uL (ref 4.0–10.5)

## 2012-06-24 LAB — BASIC METABOLIC PANEL
BUN: 11 mg/dL (ref 6–23)
CO2: 31 mEq/L (ref 19–32)
Calcium: 9.8 mg/dL (ref 8.4–10.5)
Creatinine, Ser: 0.9 mg/dL (ref 0.50–1.35)

## 2012-06-24 LAB — SURGICAL PCR SCREEN: MRSA, PCR: NEGATIVE

## 2012-06-27 ENCOUNTER — Encounter (HOSPITAL_COMMUNITY)
Admission: RE | Disposition: A | Discharge status: Home / Self Care | Payer: Self-pay | Source: Ambulatory Visit | Attending: General Surgery

## 2012-06-27 ENCOUNTER — Encounter (HOSPITAL_COMMUNITY): Payer: Self-pay | Admitting: *Deleted

## 2012-06-27 ENCOUNTER — Ambulatory Visit (HOSPITAL_COMMUNITY)
Admission: RE | Admit: 2012-06-27 | Discharge: 2012-06-27 | Disposition: A | Discharge status: Home / Self Care | Payer: Self-pay | Source: Ambulatory Visit | Attending: General Surgery | Admitting: General Surgery

## 2012-06-27 ENCOUNTER — Encounter (HOSPITAL_COMMUNITY): Payer: Self-pay | Admitting: Anesthesiology

## 2012-06-27 ENCOUNTER — Ambulatory Visit (HOSPITAL_COMMUNITY): Payer: Self-pay | Admitting: Anesthesiology

## 2012-06-27 DIAGNOSIS — K4090 Unilateral inguinal hernia, without obstruction or gangrene, not specified as recurrent: Secondary | ICD-10-CM | POA: Insufficient documentation

## 2012-06-27 DIAGNOSIS — Z01812 Encounter for preprocedural laboratory examination: Secondary | ICD-10-CM | POA: Insufficient documentation

## 2012-06-27 HISTORY — PX: INGUINAL HERNIA REPAIR: SHX194

## 2012-06-27 SURGERY — REPAIR, HERNIA, INGUINAL, ADULT
Anesthesia: General | Site: Groin | Laterality: Left | Wound class: Clean

## 2012-06-27 MED ORDER — PROPOFOL 10 MG/ML IV EMUL
INTRAVENOUS | Status: AC
  Filled 2012-06-27: qty 20

## 2012-06-27 MED ORDER — MIDAZOLAM HCL 2 MG/2ML IJ SOLN
INTRAMUSCULAR | Status: AC
  Filled 2012-06-27: qty 2

## 2012-06-27 MED ORDER — SODIUM CHLORIDE 0.9 % IR SOLN
Status: DC | PRN
  Administered 2012-06-27: 1000 mL

## 2012-06-27 MED ORDER — FENTANYL CITRATE 0.05 MG/ML IJ SOLN
25.0000 ug | INTRAMUSCULAR | Status: DC | PRN
  Administered 2012-06-27: 25 ug via INTRAVENOUS

## 2012-06-27 MED ORDER — LIDOCAINE HCL (PF) 1 % IJ SOLN
INTRAMUSCULAR | Status: AC
  Filled 2012-06-27: qty 5

## 2012-06-27 MED ORDER — CEFAZOLIN SODIUM 1-5 GM-% IV SOLN
INTRAVENOUS | Status: AC
  Filled 2012-06-27: qty 50

## 2012-06-27 MED ORDER — CHLORHEXIDINE GLUCONATE 4 % EX LIQD: 1.0000 "application " | Freq: Once | CUTANEOUS | Status: DC

## 2012-06-27 MED ORDER — LIDOCAINE HCL (CARDIAC) 10 MG/ML IV SOLN
INTRAVENOUS | Status: DC | PRN
  Administered 2012-06-27: 50 mg via INTRAVENOUS

## 2012-06-27 MED ORDER — FENTANYL CITRATE 0.05 MG/ML IJ SOLN
INTRAMUSCULAR | Status: AC
  Filled 2012-06-27: qty 2

## 2012-06-27 MED ORDER — FENTANYL CITRATE 0.05 MG/ML IJ SOLN
INTRAMUSCULAR | Status: DC | PRN
  Administered 2012-06-27: 50 ug via INTRAVENOUS
  Administered 2012-06-27: 25 ug via INTRAVENOUS
  Administered 2012-06-27: 50 ug via INTRAVENOUS
  Administered 2012-06-27: 25 ug via INTRAVENOUS
  Administered 2012-06-27 (×2): 50 ug via INTRAVENOUS
  Administered 2012-06-27: 25 ug via INTRAVENOUS

## 2012-06-27 MED ORDER — CEFAZOLIN SODIUM-DEXTROSE 2-3 GM-% IV SOLR
INTRAVENOUS | Status: DC | PRN
  Administered 2012-06-27: 2 g via INTRAVENOUS

## 2012-06-27 MED ORDER — OXYCODONE-ACETAMINOPHEN 7.5-325 MG PO TABS: 1.0000 | ORAL_TABLET | ORAL | Status: DC | PRN

## 2012-06-27 MED ORDER — BUPIVACAINE LIPOSOME 1.3 % IJ SUSP
INTRAMUSCULAR | Status: DC | PRN
  Administered 2012-06-27: 20 mL

## 2012-06-27 MED ORDER — ONDANSETRON HCL 4 MG/2ML IJ SOLN
4.0000 mg | Freq: Once | INTRAMUSCULAR | Status: AC | PRN
  Administered 2012-06-27: 4 mg via INTRAVENOUS

## 2012-06-27 MED ORDER — LACTATED RINGERS IV SOLN
INTRAVENOUS | Status: DC
  Administered 2012-06-27: 1000 mL via INTRAVENOUS
  Administered 2012-06-27: 09:00:00 via INTRAVENOUS

## 2012-06-27 MED ORDER — FENTANYL CITRATE 0.05 MG/ML IJ SOLN
25.0000 ug | INTRAMUSCULAR | Status: DC | PRN
  Administered 2012-06-27 (×4): 50 ug via INTRAVENOUS

## 2012-06-27 MED ORDER — ARTIFICIAL TEARS OP OINT
TOPICAL_OINTMENT | OPHTHALMIC | Status: AC
  Filled 2012-06-27: qty 3.5

## 2012-06-27 MED ORDER — MIDAZOLAM HCL 2 MG/2ML IJ SOLN
1.0000 mg | INTRAMUSCULAR | Status: DC | PRN
  Administered 2012-06-27: 2 mg via INTRAVENOUS

## 2012-06-27 MED ORDER — BUPIVACAINE HCL (PF) 0.5 % IJ SOLN
INTRAMUSCULAR | Status: AC
  Filled 2012-06-27: qty 30

## 2012-06-27 MED ORDER — CEFAZOLIN SODIUM-DEXTROSE 2-3 GM-% IV SOLR
INTRAVENOUS | Status: AC
  Filled 2012-06-27: qty 50

## 2012-06-27 MED ORDER — PROPOFOL 10 MG/ML IV BOLUS
INTRAVENOUS | Status: DC | PRN
  Administered 2012-06-27: 150 mg via INTRAVENOUS

## 2012-06-27 MED ORDER — ONDANSETRON HCL 4 MG/2ML IJ SOLN
INTRAMUSCULAR | Status: AC
  Filled 2012-06-27: qty 2

## 2012-06-27 MED ORDER — BUPIVACAINE LIPOSOME 1.3 % IJ SUSP
20.0000 mL | Freq: Once | INTRAMUSCULAR | Status: DC
  Filled 2012-06-27: qty 20

## 2012-06-27 SURGICAL SUPPLY — 40 items
ADH SKN CLS APL DERMABOND .7 (GAUZE/BANDAGES/DRESSINGS) ×1
BAG HAMPER (MISCELLANEOUS) ×2 IMPLANT
CLOTH BEACON ORANGE TIMEOUT ST (SAFETY) ×2 IMPLANT
COVER LIGHT HANDLE STERIS (MISCELLANEOUS) ×4 IMPLANT
DECANTER SPIKE VIAL GLASS SM (MISCELLANEOUS) ×2 IMPLANT
DERMABOND ADVANCED (GAUZE/BANDAGES/DRESSINGS) ×1
DERMABOND ADVANCED .7 DNX12 (GAUZE/BANDAGES/DRESSINGS) ×1 IMPLANT
DRAIN PENROSE 18X.75 LTX STRL (MISCELLANEOUS) ×2 IMPLANT
ELECT REM PT RETURN 9FT ADLT (ELECTROSURGICAL) ×2
ELECTRODE REM PT RTRN 9FT ADLT (ELECTROSURGICAL) ×1 IMPLANT
FORMALIN 10 PREFIL 120ML (MISCELLANEOUS) IMPLANT
GLOVE BIO SURGEON STRL SZ7.5 (GLOVE) ×4 IMPLANT
GOWN STRL REIN XL XLG (GOWN DISPOSABLE) ×6 IMPLANT
INST SET MINOR GENERAL (KITS) ×2 IMPLANT
KIT ROOM TURNOVER APOR (KITS) ×2 IMPLANT
MANIFOLD NEPTUNE II (INSTRUMENTS) ×2 IMPLANT
MESH HERNIA 1.6X1.9 PLUG LRG (Mesh General) IMPLANT
MESH HERNIA PLUG LRG (Mesh General) ×1 IMPLANT
NDL HYPO 18GX1.5 BLUNT FILL (NEEDLE) IMPLANT
NDL HYPO 21X1.5 SAFETY (NEEDLE) IMPLANT
NEEDLE HYPO 18GX1.5 BLUNT FILL (NEEDLE) ×2 IMPLANT
NEEDLE HYPO 21X1.5 SAFETY (NEEDLE) ×2 IMPLANT
NS IRRIG 1000ML POUR BTL (IV SOLUTION) ×2 IMPLANT
PACK MINOR (CUSTOM PROCEDURE TRAY) ×2 IMPLANT
PAD ARMBOARD 7.5X6 YLW CONV (MISCELLANEOUS) ×2 IMPLANT
SET BASIN LINEN APH (SET/KITS/TRAYS/PACK) ×2 IMPLANT
SOL PREP PROV IODINE SCRUB 4OZ (MISCELLANEOUS) ×2 IMPLANT
SUT NOVA NAB GS-22 2 2-0 T-19 (SUTURE) ×3 IMPLANT
SUT NOVAFIL NAB HGS22 2-0 30IN (SUTURE) IMPLANT
SUT SILK 3 0 (SUTURE)
SUT SILK 3-0 18XBRD TIE 12 (SUTURE) IMPLANT
SUT VIC AB 2-0 CT1 27 (SUTURE) ×2
SUT VIC AB 2-0 CT1 TAPERPNT 27 (SUTURE) ×1 IMPLANT
SUT VIC AB 3-0 SH 27 (SUTURE) ×2
SUT VIC AB 3-0 SH 27X BRD (SUTURE) ×1 IMPLANT
SUT VIC AB 4-0 PS2 27 (SUTURE) ×2 IMPLANT
SUT VICRYL AB 3 0 TIES (SUTURE) IMPLANT
SYR 20CC LL (SYRINGE) ×1 IMPLANT
SYR CONTROL 10ML LL (SYRINGE) ×2 IMPLANT
TOWEL OR 17X26 4PK STRL BLUE (TOWEL DISPOSABLE) ×2 IMPLANT

## 2012-06-27 NOTE — Op Note (Signed)
Patient:  [name]  DOB:  [birth year]  MRN:  [id number]   Preop Diagnosis:  Left inguinal hernia  Postop Diagnosis:  Same  Procedure:  Left inguinal herniorrhaphy  Surgeon:  Franky Macho, M.D.  Anes:  General  Indications:  Patient is a 42 year old white male who presents with a symptomatic left inguinal hernia. The risks and benefits of the procedure including bleeding, infection, and recurrence of the hernia were fully explained to the patient, who gave informed consent.  Procedure note:  The patient was placed in the supine position. After general anesthesia was administered, the left groin region was prepped and draped using usual sterile technique with Betadine. Surgical site confirmation was performed.  A left groin incision was made down to the external oblique aponeuroses. Aponeuroses was incised to the external ring. The ilioinguinal nerve was identified and retracted inferiorly from the operative field. A Penrose drain was placed around the spermatic cord. The vas deferens was noted within the spermatic cord. The indirect hernia sac was found. This was freed away from the spermatic cord up to the peritoneal reflection and inverted. A medium size PerFix Bard plug was then placed into this region secured to the transversalis fascia using a 2-0 Novafil interrupted suture. An onlay patch was then placed along the floor of inguinal canal and secured superiorly to the conjoined tendon and inferiorly to the shelving edge of Poupart's ligament using 20 Novafil interrupted sutures. The internal ring was recreated using a 2-0 Novafil interrupted suture. The external oblique aponeuroses was reapproximated using a 2-0 Vicryl running suture. The subcutaneous layer was reapproximated using 3-0 Vicryl interrupted suture. The skin was closed using a 4-0 Vicryl subcuticular suture. Exparel was then instilled into the surrounding wound. Dermabond was then applied.  All tape and needle counts were  correct at the end of the procedure. Patient was awakened and transferred to PACU in stable condition.  Complications:  None  EBL:  Minimal  Specimen:  None

## 2012-06-27 NOTE — Anesthesia Postprocedure Evaluation (Signed)
  Anesthesia Post-op Note  Patient: [name]  Procedure(s) Performed: Procedure(s): HERNIA REPAIR INGUINAL ADULT (Left)  Patient Location: PACU  Anesthesia Type:General  Level of Consciousness: sedated and patient cooperative  Airway and Oxygen Therapy: Patient Spontanous Breathing and Patient connected to face mask oxygen  Post op pain: moderate  Post-op Assessment: Post-op Vital signs reviewed, Patient's Cardiovascular Status Stable, Respiratory Function Stable, Patent Airway, No signs of Nausea or vomiting and Pain level controlled  Post-op Vital Signs: Reviewed and stable  Complications: No apparent anesthesia complications

## 2012-06-27 NOTE — Progress Notes (Signed)
Sleeping at intervals. Rates pain 10 while awake. Returns to sleep. Wants to see girlfriend.

## 2012-06-27 NOTE — Progress Notes (Signed)
Sleeping at intervals. Rates pain 10 while awake. Returns to sleep easily.

## 2012-06-27 NOTE — Preoperative (Signed)
Beta Blockers   Reason not to administer Beta Blockers:Not Applicable 

## 2012-06-27 NOTE — Progress Notes (Signed)
From OR. Arousing. Yelling. "It burns." rates pain 10. Restless. Kicking. Oriented to place per nurse. Reassurance given. meds per anesthesia. No scrotal edema.

## 2012-06-27 NOTE — Interval H&P Note (Signed)
History and Physical Interval Note:  06/27/2012 8:25 AM  [name]  has presented today for surgery, with the diagnosis of left inguinal hernia  The various methods of treatment have been discussed with the patient and family. After consideration of risks, benefits and other options for treatment, the patient has consented to  Procedure(s): HERNIA REPAIR INGUINAL ADULT (Left) as a surgical intervention .  The patient's history has been reviewed, patient examined, no change in status, stable for surgery.  I have reviewed the patient's chart and labs.  Questions were answered to the patient's satisfaction.     Franky Macho A

## 2012-06-27 NOTE — Transfer of Care (Signed)
Immediate Anesthesia Transfer of Care Note  Patient: [name]  Procedure(s) Performed: Procedure(s): HERNIA REPAIR INGUINAL ADULT (Left)  Patient Location: PACU  Anesthesia Type:General  Level of Consciousness: awake, alert , oriented and patient cooperative  Airway & Oxygen Therapy: Patient Spontanous Breathing and Patient connected to face mask oxygen  Post-op Assessment: Report given to PACU RN and Post -op Vital signs reviewed and stable  Post vital signs: Reviewed and stable  Complications: No apparent anesthesia complications

## 2012-06-27 NOTE — Anesthesia Procedure Notes (Signed)
Procedure Name: LMA Insertion Date/Time: 06/27/2012 8:51 AM Performed by: Carolyne Littles, AMY L Pre-anesthesia Checklist: Patient identified, Timeout performed, Emergency Drugs available, Suction available and Patient being monitored Patient Re-evaluated:Patient Re-evaluated prior to inductionOxygen Delivery Method: Circle system utilized Preoxygenation: Pre-oxygenation with 100% oxygen Intubation Type: IV induction Ventilation: Mask ventilation without difficulty LMA: LMA inserted LMA Size: 4.0 Number of attempts: 1 Placement Confirmation: positive ETCO2 and breath sounds checked- equal and bilateral Tube secured with: Tape Dental Injury: Teeth and Oropharynx as per pre-operative assessment

## 2012-06-27 NOTE — Anesthesia Preprocedure Evaluation (Addendum)
Anesthesia Evaluation  Patient identified by MRN, date of birth, ID band Patient awake    Reviewed: Allergy & Precautions, H&P , NPO status , Patient's Chart, lab work & pertinent test results  Airway Mallampati: II TM Distance: >3 FB Neck ROM: Full    Dental  (+) Poor Dentition and Dental Advisory Given,    Pulmonary shortness of breath, Current Smoker,  breath sounds clear to auscultation        Cardiovascular negative cardio ROS  Rhythm:Regular Rate:Normal     Neuro/Psych PSYCHIATRIC DISORDERS Anxiety Depression    GI/Hepatic GERD-  Controlled and Medicated,  Endo/Other    Renal/GU      Musculoskeletal   Abdominal   Peds  Hematology   Anesthesia Other Findings   Reproductive/Obstetrics                           Anesthesia Physical Anesthesia Plan  ASA: II  Anesthesia Plan: General   Post-op Pain Management:    Induction: Intravenous  Airway Management Planned: LMA  Additional Equipment:   Intra-op Plan:   Post-operative Plan: Extubation in OR  Informed Consent: I have reviewed the patients History and Physical, chart, labs and discussed the procedure including the risks, benefits and alternatives for the proposed anesthesia with the patient or authorized representative who has indicated his/her understanding and acceptance.     Plan Discussed with:   Anesthesia Plan Comments:         Anesthesia Quick Evaluation

## 2012-06-30 ENCOUNTER — Encounter (HOSPITAL_COMMUNITY): Payer: Self-pay | Admitting: General Surgery

## 2012-12-29 ENCOUNTER — Encounter (HOSPITAL_COMMUNITY): Payer: Self-pay | Admitting: Emergency Medicine

## 2012-12-29 ENCOUNTER — Emergency Department (HOSPITAL_COMMUNITY)
Admission: EM | Admit: 2012-12-29 | Discharge: 2012-12-29 | Discharge status: Against Medical Advice | Payer: Self-pay | Attending: Emergency Medicine | Admitting: Emergency Medicine

## 2012-12-29 ENCOUNTER — Emergency Department (HOSPITAL_COMMUNITY): Payer: Self-pay

## 2012-12-29 DIAGNOSIS — Z8719 Personal history of other diseases of the digestive system: Secondary | ICD-10-CM | POA: Insufficient documentation

## 2012-12-29 DIAGNOSIS — Z79899 Other long term (current) drug therapy: Secondary | ICD-10-CM | POA: Insufficient documentation

## 2012-12-29 DIAGNOSIS — F17200 Nicotine dependence, unspecified, uncomplicated: Secondary | ICD-10-CM | POA: Insufficient documentation

## 2012-12-29 DIAGNOSIS — R109 Unspecified abdominal pain: Secondary | ICD-10-CM | POA: Insufficient documentation

## 2012-12-29 DIAGNOSIS — Z9889 Other specified postprocedural states: Secondary | ICD-10-CM | POA: Insufficient documentation

## 2012-12-29 DIAGNOSIS — R1030 Lower abdominal pain, unspecified: Secondary | ICD-10-CM

## 2012-12-29 DIAGNOSIS — G8929 Other chronic pain: Secondary | ICD-10-CM | POA: Insufficient documentation

## 2012-12-29 DIAGNOSIS — F411 Generalized anxiety disorder: Secondary | ICD-10-CM | POA: Insufficient documentation

## 2012-12-29 LAB — CBC WITH DIFFERENTIAL/PLATELET
Basophils Absolute: 0 10*3/uL (ref 0.0–0.1)
HCT: 45.5 % (ref 39.0–52.0)
Hemoglobin: 15.3 g/dL (ref 13.0–17.0)
Lymphocytes Relative: 29 % (ref 12–46)
Monocytes Absolute: 0.7 10*3/uL (ref 0.1–1.0)
Monocytes Relative: 7 % (ref 3–12)
Neutro Abs: 5.8 10*3/uL (ref 1.7–7.7)
WBC: 9.5 10*3/uL (ref 4.0–10.5)

## 2012-12-29 LAB — COMPREHENSIVE METABOLIC PANEL
AST: 36 U/L (ref 0–37)
Alkaline Phosphatase: 65 U/L (ref 39–117)
CO2: 24 mEq/L (ref 19–32)
Chloride: 105 mEq/L (ref 96–112)
Creatinine, Ser: 0.94 mg/dL (ref 0.50–1.35)
GFR calc non Af Amer: >90 mL/min (ref >90)
Potassium: 4.1 mEq/L (ref 3.5–5.1)
Total Bilirubin: 0.2 mg/dL — ABNORMAL LOW (ref 0.3–1.2)

## 2012-12-29 LAB — ETHANOL: Alcohol, Ethyl (B): 198 mg/dL — ABNORMAL HIGH (ref 0–11)

## 2012-12-29 MED ORDER — ACETAMINOPHEN 500 MG PO TABS: 1000.0000 mg | ORAL_TABLET | Freq: Once | ORAL | Status: DC

## 2012-12-29 NOTE — ED Notes (Signed)
Patient demanding to see MD due to not getting pain medication.  Dr. Judd Lien aware and in to speak with patient.

## 2012-12-29 NOTE — ED Notes (Signed)
Dr. Judd Lien informed patient that he is not going to be giving him pain medication for a chronic condition.  Patient removed his own IV.  Bandage applied by Kathi Der, RN.

## 2012-12-29 NOTE — ED Provider Notes (Signed)
CSN: [id number]     Arrival date & time 12/29/12  0016 History   First MD Initiated Contact with Patient 12/29/12 0020     Chief Complaint  Patient presents with  . Groin Pain   (Consider location/radiation/quality/duration/timing/severity/associated sxs/prior Treatment) HPI Comments: Patient is a 41 year old male with history of hernia repair in the past. He presents here with complaints of severe pain in the groin for the past several days. He denies vomiting and denies any abnormal bowel movements. He denies constipation. States he has been trying to control his pain with alcohol however this is not helping. He called EMS this evening and was transported here for evaluation. He appears intoxicated and is borderline uncooperative.  Patient is a 42 y.o. male presenting with groin pain. The history is provided by the patient.  Groin Pain This is a recurrent problem. Episode onset: 3 days ago. The problem occurs constantly. The problem has been gradually worsening. Pertinent negatives include no abdominal pain, no headaches and no shortness of breath. Nothing aggravates the symptoms. Nothing relieves the symptoms. Treatments tried: Alcohol. The treatment provided no relief.    Past Medical History  Diagnosis Date  . Other, mixed, or unspecified nondependent drug abuse, unspecified   . Personal history of unspecified digestive disease   . Abdominal pain, unspecified site   . Bipolar disorder, unspecified   . Backache, unspecified   . Tobacco use disorder   . Crohn's   . Chronic back pain   . Shortness of breath     exertion  . GERD (gastroesophageal reflux disease)   . IBS (irritable bowel syndrome)   . Depression   . Anxiety    Past Surgical History  Procedure Laterality Date  . Right inguinal hernia repair    . Hernia repair    . Inguinal hernia repair Left 06/27/2012    Procedure: HERNIA REPAIR INGUINAL ADULT;  Surgeon: Dalia Heading, MD;  Location: [facility];  Service: General;   Laterality: Left;   Family History  Problem Relation Age of Onset  . Lung cancer Father   . Cancer Father     lung  . Heart disease Mother    History  Substance Use Topics  . Smoking status: Current Every Day Smoker -- 1.00 packs/day for 25 years    Types: Cigarettes  . Smokeless tobacco: Not on file  . Alcohol Use: Yes    Review of Systems  Respiratory: Negative for shortness of breath.   Gastrointestinal: Negative for abdominal pain.  Neurological: Negative for headaches.  All other systems reviewed and are negative.    Allergies  Fluoxetine hcl; Ketorolac tromethamine; Nsaids; and Ultram  Home Medications   Current Outpatient Rx  Name  Route  Sig  Dispense  Refill  . albuterol (PROVENTIL HFA) 108 (90 BASE) MCG/ACT inhaler   Inhalation   Inhale 2 puffs into the lungs every 6 (six) hours as needed. for shortness of breath         . diazepam (VALIUM) 5 MG tablet   Oral   Take 5 mg by mouth 3 (three) times daily.         Marland Kitchen oxyCODONE-acetaminophen (PERCOCET) 7.5-325 MG per tablet   Oral   Take 1-2 tablets by mouth every 4 (four) hours as needed for pain.   60 tablet   0   . promethazine (PHENERGAN) 25 MG tablet   Oral   Take 1 tablet (25 mg total) by mouth every 6 (six) hours as needed for  nausea.   30 tablet   0    BP 147/98  Pulse 115  Temp(Src) 98.2 F (36.8 C) (Oral)  Resp 20  Ht 6\' 1"  (1.854 m)  Wt 205 lb (92.987 kg)  BMI 27.05 kg/m2  SpO2 95% Physical Exam  Nursing note and vitals reviewed. Constitutional: He is oriented to person, place, and time. He appears well-developed and well-nourished. No distress.  Patient is somewhat disheveled with a strong odor of alcohol present. He appears intoxicated.  HENT:  Head: Normocephalic and atraumatic.  Mouth/Throat: Oropharynx is clear and moist.  Neck: Normal range of motion. Neck supple.  Cardiovascular: Normal rate, regular rhythm and normal heart sounds.   No murmur heard. Pulmonary/Chest:  Effort normal and breath sounds normal. No respiratory distress. He has no wheezes.  Abdominal: Soft. Bowel sounds are normal.  I am unable to palpate a definite hernia or defect, however he reports severe discomfort in the left groin when palpated.  Musculoskeletal: Normal range of motion. He exhibits no edema.  Neurological: He is alert and oriented to person, place, and time.  Skin: Skin is warm and dry. He is not diaphoretic.    ED Course  Procedures (including critical care time) Labs Review Labs Reviewed  CBC WITH DIFFERENTIAL  COMPREHENSIVE METABOLIC PANEL   Imaging Review No results found.    MDM  No diagnosis found. Patient is a 42 year old male well-known to the emergency department. He presents today with complaints of severe groin pain and appears intoxicated. My intentions were to perform a CT scan of the abdomen and pelvis to rule out incarcerated hernia. Patient began to demand pain medication which I did not feel comfortable administrating after he had been drinking. He became angry and argumentative and told me that I was "just wasting his time". He took it upon himself to decide that he no longer wanted to be in the department and was requesting discharge. He became somewhat belligerent and uncooperative and was ultimately escorted from the hospital property by security.  I do not feel as though his exam was consistent with an incarcerated hernia. He was not vomiting and his examination was benign. There were no palpable abnormalities and I suspect the reason for his visit was to obtain pain medication which I was uncomfortable providing for his chronic condition and also due to the fact that he had been drinking.    Geoffery Lyons, MD 12/29/12 [phone]

## 2012-12-29 NOTE — ED Notes (Signed)
Patient left AMA after speaking with Dr. Judd Lien.  Patient refused to sign.

## 2012-12-29 NOTE — ED Notes (Signed)
Patient c/o left groin pain for several days.  Patient has strong smell of ETOH.

## 2013-01-11 ENCOUNTER — Encounter (HOSPITAL_COMMUNITY): Payer: Self-pay | Admitting: Emergency Medicine

## 2013-01-11 ENCOUNTER — Emergency Department (HOSPITAL_COMMUNITY)
Admission: EM | Admit: 2013-01-11 | Discharge: 2013-01-11 | Disposition: A | Discharge status: Home / Self Care | Payer: Self-pay | Attending: Emergency Medicine | Admitting: Emergency Medicine

## 2013-01-11 DIAGNOSIS — Z8719 Personal history of other diseases of the digestive system: Secondary | ICD-10-CM | POA: Insufficient documentation

## 2013-01-11 DIAGNOSIS — F17200 Nicotine dependence, unspecified, uncomplicated: Secondary | ICD-10-CM | POA: Insufficient documentation

## 2013-01-11 DIAGNOSIS — F411 Generalized anxiety disorder: Secondary | ICD-10-CM | POA: Insufficient documentation

## 2013-01-11 DIAGNOSIS — R110 Nausea: Secondary | ICD-10-CM | POA: Insufficient documentation

## 2013-01-11 DIAGNOSIS — R109 Unspecified abdominal pain: Secondary | ICD-10-CM | POA: Insufficient documentation

## 2013-01-11 DIAGNOSIS — Z9889 Other specified postprocedural states: Secondary | ICD-10-CM | POA: Insufficient documentation

## 2013-01-11 DIAGNOSIS — Z79899 Other long term (current) drug therapy: Secondary | ICD-10-CM | POA: Insufficient documentation

## 2013-01-11 DIAGNOSIS — G8929 Other chronic pain: Secondary | ICD-10-CM | POA: Insufficient documentation

## 2013-01-11 LAB — URINALYSIS, ROUTINE W REFLEX MICROSCOPIC
BILIRUBIN URINE: NEGATIVE
Glucose, UA: NEGATIVE mg/dL
HGB URINE DIPSTICK: NEGATIVE
Ketones, ur: NEGATIVE mg/dL
Leukocytes, UA: NEGATIVE
Nitrite: NEGATIVE
PROTEIN: NEGATIVE mg/dL
SPECIFIC GRAVITY, URINE: 1.025 (ref 1.005–1.030)
Urobilinogen, UA: 0.2 mg/dL (ref 0.0–1.0)
pH: 6 (ref 5.0–8.0)

## 2013-01-11 MED ORDER — METHOCARBAMOL 500 MG PO TABS: 500.0000 mg | ORAL_TABLET | Freq: Two times a day (BID) | ORAL | Status: DC

## 2013-01-11 MED ORDER — ETODOLAC 200 MG PO CAPS: 200.0000 mg | ORAL_CAPSULE | Freq: Three times a day (TID) | ORAL | Status: AC

## 2013-01-11 NOTE — ED Notes (Signed)
Pt states right flank pain x 2 weeks. Nausea due to pain. Denies vomiting. Mid back pani x 3 days.

## 2013-01-11 NOTE — ED Notes (Signed)
Water given per request to provide urine sample

## 2013-01-11 NOTE — ED Provider Notes (Signed)
CSN: [id number]     Arrival date & time 01/11/13  0719 History  This chart was scribed for Gilda Creasehristopher J. Jaslen Adcox, MD by Bennett Scrapehristina Taylor, ED Scribe. This patient was seen in room [facility] and the patient's care was started at 7:35 AM.   Chief Complaint  Patient presents with  . Flank Pain    The history is provided by the patient. No language interpreter was used.    HPI Comments: [name] is a 43 y.o. male who presents to the Emergency Department complaining of left flank pain that radiates into the upper back described as sharp with associated nausea. The pain has been intermittent for the past 3 months but worse for the past 3 weeks. He reports an inguinal surgery on 06/27/12 and states that it feels like "they hit a nerve". He reports having episodes of "phantom" pain after the surgery that felt similar. He states that he used to have testicular swelling and pain after the surgery but states that he has not had any issues since. He denies any emesis. He denies any swelling or bulges in the surgery area.  Pt was seen in the ED on 12/29/12 for post-surgical pain. He was intoxicated and demanded pain medication. When that request was denied, he became belligerent and was escorted off of hospital property.   Past Medical History  Diagnosis Date  . Other, mixed, or unspecified nondependent drug abuse, unspecified   . Personal history of unspecified digestive disease   . Abdominal pain, unspecified site   . Bipolar disorder, unspecified   . Backache, unspecified   . Tobacco use disorder   . Crohn's   . Chronic back pain   . Shortness of breath     exertion  . GERD (gastroesophageal reflux disease)   . IBS (irritable bowel syndrome)   . Depression   . Anxiety    Past Surgical History  Procedure Laterality Date  . Right inguinal hernia repair    . Hernia repair    . Inguinal hernia repair Left 06/27/2012    Procedure: HERNIA REPAIR INGUINAL ADULT;  Surgeon: Dalia HeadingMark A Jenkins, MD;   Location: [facility];  Service: General;  Laterality: Left;   Family History  Problem Relation Age of Onset  . Lung cancer Father   . Cancer Father     lung  . Heart disease Mother    History  Substance Use Topics  . Smoking status: Current Every Day Smoker -- 1.00 packs/day for 25 years    Types: Cigarettes  . Smokeless tobacco: Not on file  . Alcohol Use: Yes    Review of Systems  Gastrointestinal: Positive for nausea. Negative for vomiting.  Genitourinary: Positive for flank pain.  Musculoskeletal: Positive for back pain.  All other systems reviewed and are negative.    Allergies  Fluoxetine hcl; Ketorolac tromethamine; Nsaids; and Ultram  Home Medications   Current Outpatient Rx  Name  Route  Sig  Dispense  Refill  . albuterol (PROVENTIL HFA) 108 (90 BASE) MCG/ACT inhaler   Inhalation   Inhale 2 puffs into the lungs every 6 (six) hours as needed. for shortness of breath         . diazepam (VALIUM) 5 MG tablet   Oral   Take 5 mg by mouth 3 (three) times daily.         Marland Kitchen. oxyCODONE-acetaminophen (PERCOCET) 7.5-325 MG per tablet   Oral   Take 1-2 tablets by mouth every 4 (four) hours as needed for  pain.   60 tablet   0   . promethazine (PHENERGAN) 25 MG tablet   Oral   Take 1 tablet (25 mg total) by mouth every 6 (six) hours as needed for nausea.   30 tablet   0    Triage Vitals: BP 131/92  Pulse 101  Temp(Src) 98.7 F (37.1 C) (Oral)  Ht 6\' 1"  (1.854 m)  Wt 218 lb (98.884 kg)  BMI 28.77 kg/m2  SpO2 98%  Physical Exam  Nursing note and vitals reviewed. Constitutional: He is oriented to person, place, and time. He appears well-developed and well-nourished. No distress.  HENT:  Head: Normocephalic and atraumatic.  Right Ear: Hearing normal.  Left Ear: Hearing normal.  Eyes: Conjunctivae and EOM are normal.  Neck: Normal range of motion. Neck supple.  Cardiovascular: Normal rate, regular rhythm, S1 normal and S2 normal.  Exam reveals no gallop and  no friction rub.   No murmur heard. Pulmonary/Chest: Effort normal and breath sounds normal. No respiratory distress. He exhibits no tenderness.  Abdominal: Soft. Normal appearance and bowel sounds are normal. There is no hepatosplenomegaly. There is no tenderness. There is no rebound, no guarding, no tenderness at McBurney's point and negative Murphy's sign. No hernia.  Musculoskeletal: Normal range of motion.       Lumbar back: He exhibits tenderness. He exhibits no bony tenderness.  Mild tenderness to the left lower back  Neurological: He is alert and oriented to person, place, and time. He has normal strength. No cranial nerve deficit or sensory deficit. Coordination normal. GCS eye subscore is 4. GCS verbal subscore is 5. GCS motor subscore is 6.  Reflex Scores:      Patellar reflexes are 1+ on the right side and 1+ on the left side. Skin: Skin is warm, dry and intact. No rash noted. No cyanosis.  Psychiatric: He has a normal mood and affect. His speech is normal and behavior is normal. Thought content normal.    ED Course  Procedures (including critical care time)  DIAGNOSTIC STUDIES: Oxygen Saturation is 978% on RA, normal by my interpretation.    COORDINATION OF CARE: 7:39 AM-Discussed treatment plan which includes UA with pt at bedside and pt agreed to plan.   Labs Review Labs Reviewed  URINALYSIS, ROUTINE W REFLEX MICROSCOPIC   Imaging Review No results found.  EKG Interpretation   None       MDM  Diagnosis: Back pain  Patient presents to the ER for evaluation of pain in the back and flank area. Patient reports that he has a history of bilateral inguinal hernia repairs and has had a lot of pain from "nerve damage". He has a benign abdominal exam, no concern for intra-abdominal pathology or recurrent hernia. Patient's pain is vague and transient, moving around. He has normal neurologic function. Urinalysis does not show any sign of urinary infection or hematuria. This  appears to be consistent with the patient's chronic pain that he has had in the back and abdomen region, no further workup necessary. Patient will need to followup with Doctor Sherwood Gambler further management of his chronic pain.  I personally performed the services described in this documentation, which was scribed in my presence. The recorded information has been reviewed and is accurate.    Gilda Crease, MD 01/11/13 [phone]

## 2013-01-11 NOTE — Discharge Instructions (Signed)
Chronic Back Pain   When back pain lasts longer than 3 months, it is called chronic back pain.People with chronic back pain often go through certain periods that are more intense (flare-ups).   CAUSES  Chronic back pain can be caused by wear and tear (degeneration) on different structures in your back. These structures include:   The bones of your spine (vertebrae) and the joints surrounding your spinal cord and nerve roots (facets).   The strong, fibrous tissues that connect your vertebrae (ligaments).  Degeneration of these structures may result in pressure on your nerves. This can lead to constant pain.  HOME CARE INSTRUCTIONS   Avoid bending, heavy lifting, prolonged sitting, and activities which make the problem worse.   Take brief periods of rest throughout the day to reduce your pain. Lying down or standing usually is better than sitting while you are resting.   Take over-the-counter or prescription medicines only as directed by your caregiver.  SEEK IMMEDIATE MEDICAL CARE IF:    You have weakness or numbness in one of your legs or feet.   You have trouble controlling your bladder or bowels.   You have nausea, vomiting, abdominal pain, shortness of breath, or fainting.  Document Released: 02/02/2004 Document Revised: 03/19/2011 Document Reviewed: 12/09/2010  [employer] Patient Information 2014 [employer]

## 2013-04-25 IMAGING — CT CT ABD-PELV W/ CM
2 of 4 series · 15 of 46 positions shown, 17 images · IV contrast (Omnipaque 300)
Comparison: CT abdomen pelvis of 07/02/2011.

CLINICAL DATA: Abdominal pain, history of hernia repair

CT ABDOMEN AND PELVIS WITH CONTRAST
TECHNIQUE: Multidetector CT imaging of the abdomen and pelvis was
performed following the standard protocol during bolus
administration of intravenous contrast.
Contrast: 50mL OMNIPAQUE IOHEXOL 300 MG/ML  SOLN, 100mL OMNIPAQUE
IOHEXOL 300 MG/ML  SOLN

[Series 2: abd_pel_with 5.0 b40f · axial · 0.68mm/px · z∈[-545,-120]mm · 12 of 95 slices shown, 14 images]
[im 5/95  soft-tissue]
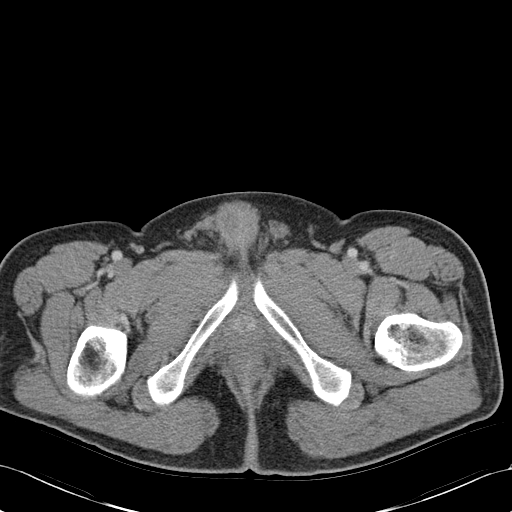
[im 5/95  bone]
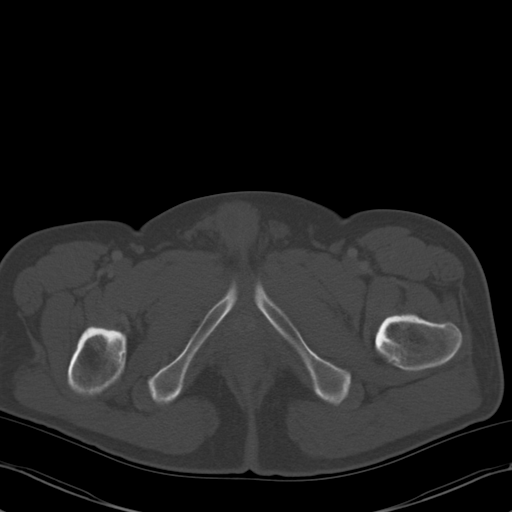
[im 14/95  soft-tissue]
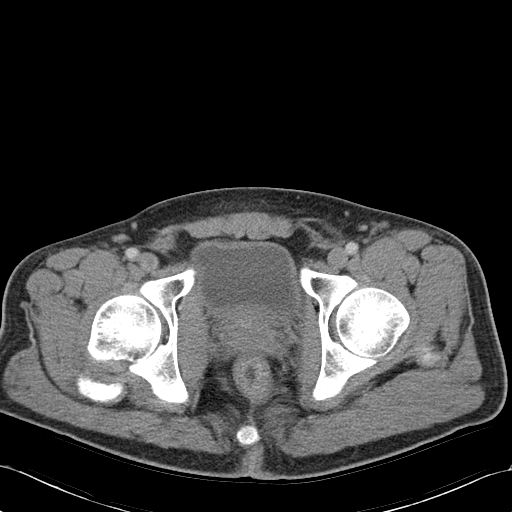
[im 23/95  soft-tissue]
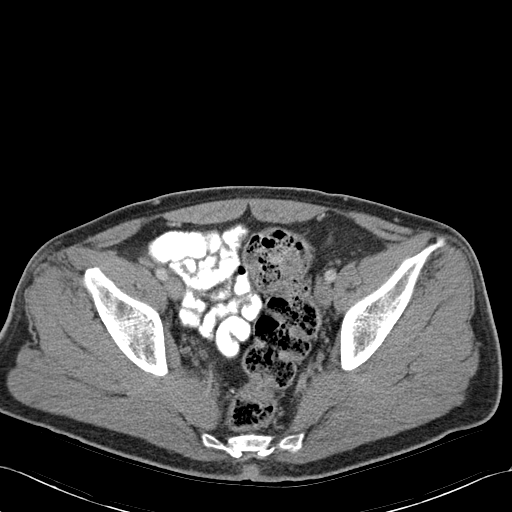
[im 27/95  soft-tissue]
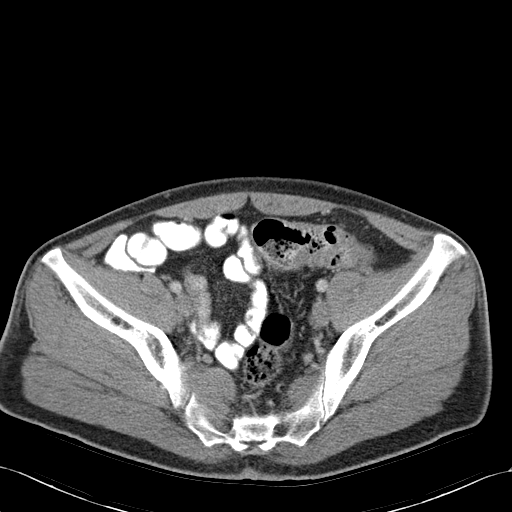
[im 36/95  soft-tissue]
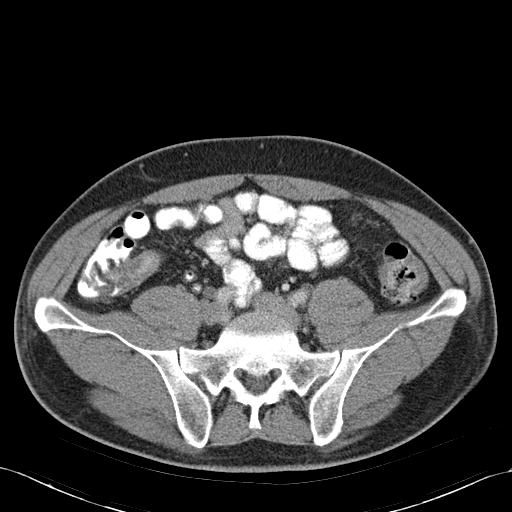
[im 45/95  soft-tissue]
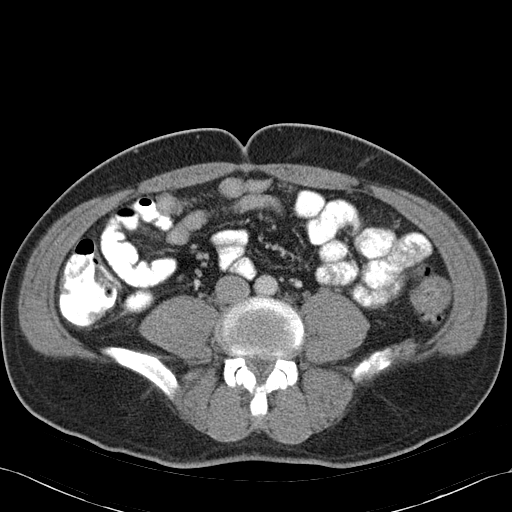
[im 50/95  soft-tissue]
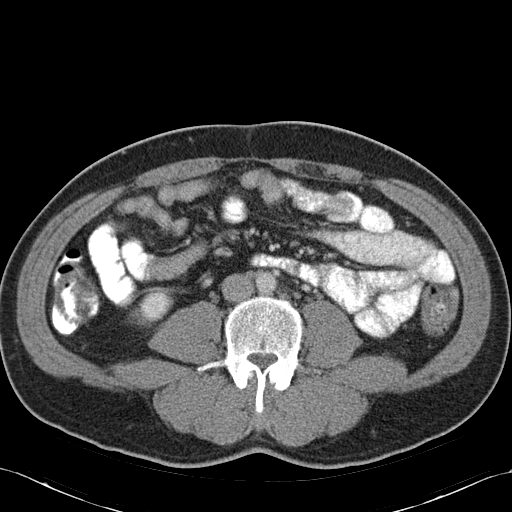
[im 59/95  soft-tissue]
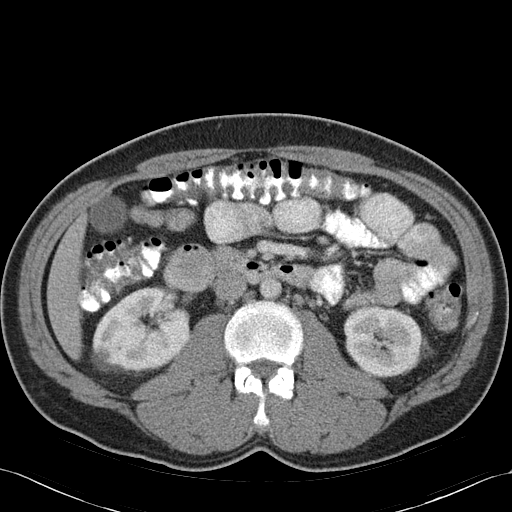
[im 68/95  soft-tissue]
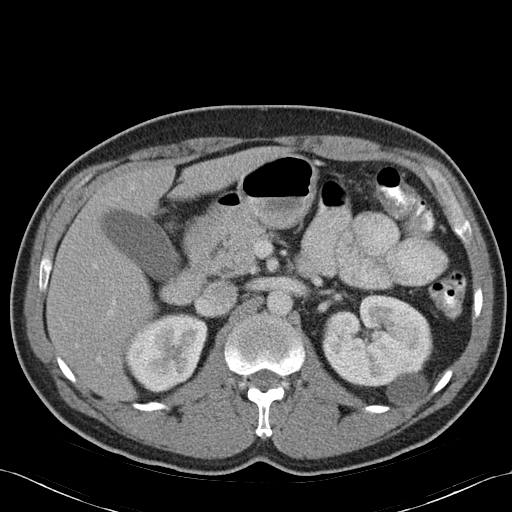
[im 68/95  bone]
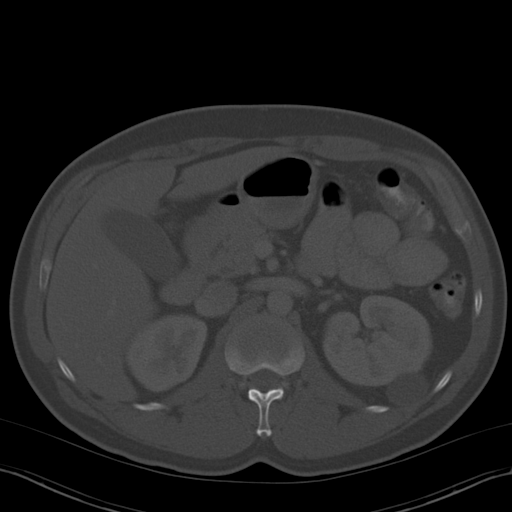
[im 72/95  soft-tissue]
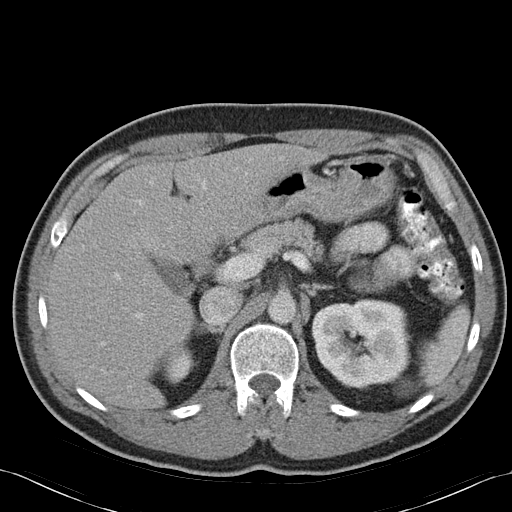
[im 81/95  soft-tissue]
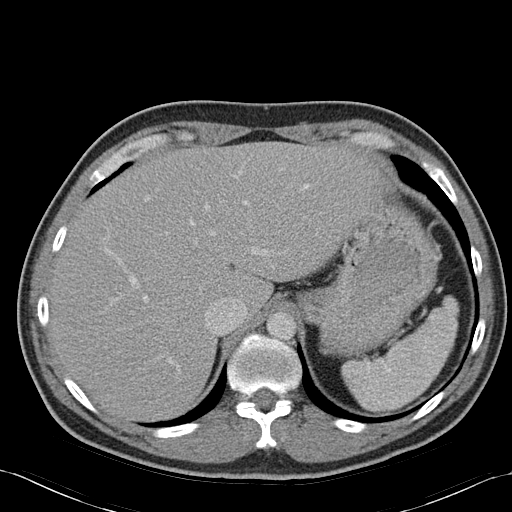
[im 90/95  soft-tissue]
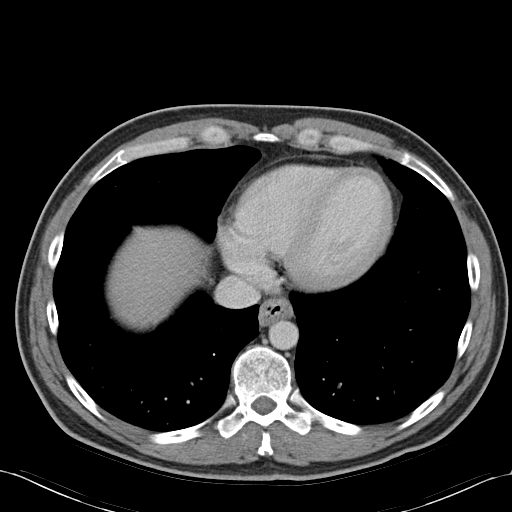

[Series 4: abd_pel_with 3.0 spo cor · coronal · 0.62mm/px · 3 of 68 slices shown]
[im 23/68  soft-tissue]
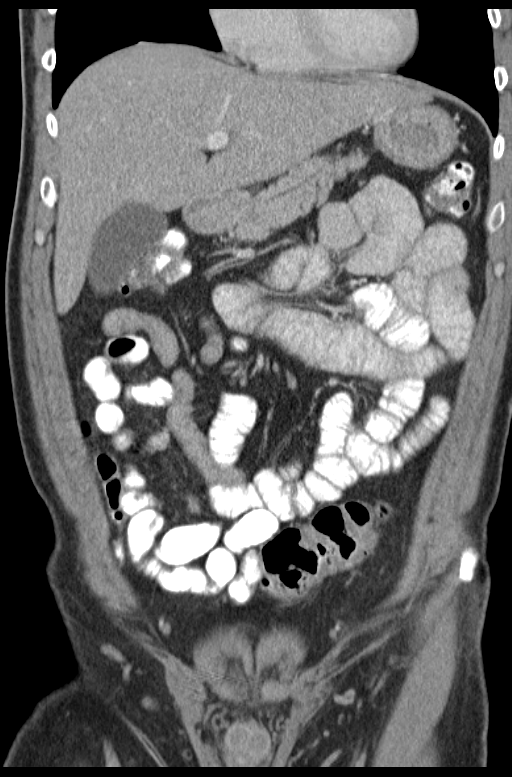
[im 30/68  soft-tissue]
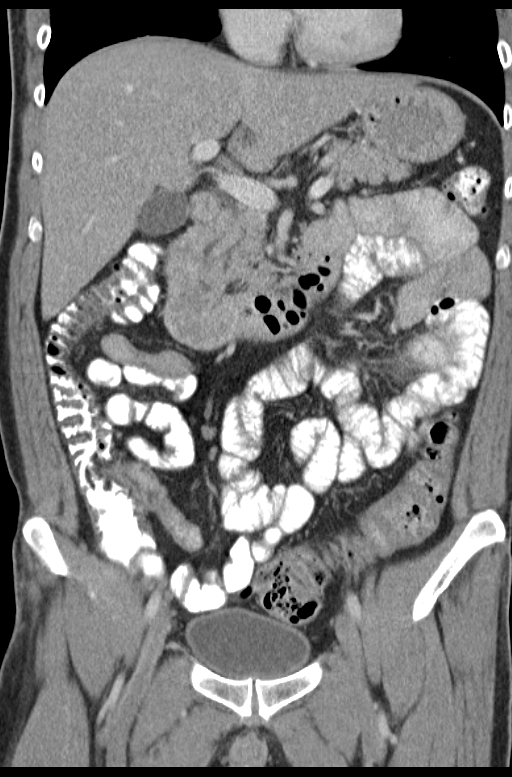
[im 38/68  soft-tissue]
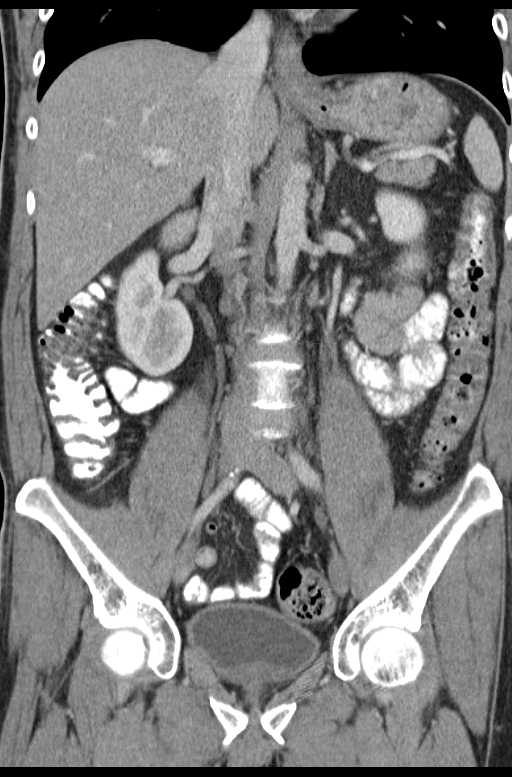

[15 of 46 positions shown; findings below may reference images not displayed]

FINDINGS: The lung bases are clear.  There may be a small hiatal
hernia present versus reflux esophagitis with some thickening of
the mucosa in the region of the distal esophagus.  The liver
enhances with no focal abnormality and no ductal dilatation is
seen.  There may be mild fatty infiltration of the liver with some
sparing near the gallbladder.  No calcified gallstones are seen.
The pancreas is normal in size and the pancreatic duct is not
dilated.  The adrenal glands and spleen are unremarkable.  The
stomach is not well distended.  The kidneys enhance with no
calculus or mass.  There are several small cysts present but no
solid renal lesion is seen.  The abdominal aorta is normal in
caliber.  No adenopathy is seen.  A few small retroperitoneal nodes
are present.

The urinary bladder is not well distended but is unremarkable.  The
prostate is normal in size.  No fluid is seen within the pelvis.
The colon is largely decompressed but no colonic lesion is evident.
The terminal ileum and the appendix are unremarkable.  No inguinal
hernia is seen.  No abdominal wall hernia is noted.  No skeletal
abnormality is seen.
IMPRESSION: 1.  No evidence of inguinal or abdominal wall hernia is seen.
2.  Cannot exclude small hiatal hernia versus reflux esophagitis
with somewhat thickened mucosa of the distal esophagus.
3.  The appendix and the terminal ileum are unremarkable.

## 2014-04-09 ENCOUNTER — Emergency Department (HOSPITAL_COMMUNITY)
Admission: EM | Admit: 2014-04-09 | Discharge: 2014-04-09 | Disposition: A | Discharge status: Home / Self Care | Payer: Self-pay | Attending: Emergency Medicine | Admitting: Emergency Medicine

## 2014-04-09 ENCOUNTER — Encounter (HOSPITAL_COMMUNITY): Payer: Self-pay | Admitting: Emergency Medicine

## 2014-04-09 DIAGNOSIS — Z79899 Other long term (current) drug therapy: Secondary | ICD-10-CM | POA: Insufficient documentation

## 2014-04-09 DIAGNOSIS — M545 Low back pain: Secondary | ICD-10-CM

## 2014-04-09 DIAGNOSIS — F419 Anxiety disorder, unspecified: Secondary | ICD-10-CM | POA: Insufficient documentation

## 2014-04-09 DIAGNOSIS — H269 Unspecified cataract: Secondary | ICD-10-CM

## 2014-04-09 DIAGNOSIS — Z8719 Personal history of other diseases of the digestive system: Secondary | ICD-10-CM | POA: Insufficient documentation

## 2014-04-09 DIAGNOSIS — G8929 Other chronic pain: Secondary | ICD-10-CM | POA: Insufficient documentation

## 2014-04-09 DIAGNOSIS — Z720 Tobacco use: Secondary | ICD-10-CM | POA: Insufficient documentation

## 2014-04-09 DIAGNOSIS — Z7982 Long term (current) use of aspirin: Secondary | ICD-10-CM | POA: Insufficient documentation

## 2014-04-09 MED ORDER — CYCLOBENZAPRINE HCL 10 MG PO TABS: 10.0000 mg | ORAL_TABLET | Freq: Two times a day (BID) | ORAL | Status: DC | PRN

## 2014-04-09 MED ORDER — PREDNISONE 10 MG PO TABS: 20.0000 mg | ORAL_TABLET | Freq: Two times a day (BID) | ORAL | Status: AC

## 2014-04-09 MED ORDER — FAMOTIDINE 20 MG PO TABS
20.0000 mg | ORAL_TABLET | Freq: Once | ORAL | Status: AC
  Administered 2014-04-09: 20 mg via ORAL
  Filled 2014-04-09: qty 1

## 2014-04-09 MED ORDER — PREDNISONE 50 MG PO TABS
60.0000 mg | ORAL_TABLET | Freq: Once | ORAL | Status: AC
  Administered 2014-04-09: 15:00:00 60 mg via ORAL
  Filled 2014-04-09 (×2): qty 1

## 2014-04-09 MED ORDER — ONDANSETRON 4 MG PO TBDP
4.0000 mg | ORAL_TABLET | Freq: Once | ORAL | Status: AC
  Administered 2014-04-09: 4 mg via ORAL
  Filled 2014-04-09: qty 1

## 2014-04-09 MED ORDER — BACITRACIN-NEOMYCIN-POLYMYXIN 400-5-5000 EX OINT: TOPICAL_OINTMENT | Freq: Once | CUTANEOUS | Status: DC

## 2014-04-09 MED ORDER — OXYCODONE-ACETAMINOPHEN 5-325 MG PO TABS
1.0000 | ORAL_TABLET | Freq: Once | ORAL | Status: AC
  Administered 2014-04-09: 1 via ORAL
  Filled 2014-04-09: qty 1

## 2014-04-09 MED ORDER — KETOROLAC TROMETHAMINE 60 MG/2ML IM SOLN
30.0000 mg | Freq: Once | INTRAMUSCULAR | Status: AC
  Administered 2014-04-09: 30 mg via INTRAMUSCULAR
  Filled 2014-04-09: qty 2

## 2014-04-09 MED ORDER — CYCLOBENZAPRINE HCL 10 MG PO TABS
10.0000 mg | ORAL_TABLET | Freq: Once | ORAL | Status: AC
  Administered 2014-04-09: 10 mg via ORAL
  Filled 2014-04-09: qty 1

## 2014-04-09 NOTE — ED Provider Notes (Signed)
CSN: [id number]     Arrival date & time 04/09/14  1136 History   First MD Initiated Contact with Patient 04/09/14 1217     Chief Complaint  Patient presents with  . Back Pain     (Consider location/radiation/quality/duration/timing/severity/associated sxs/prior Treatment) Patient is a 44 y.o. male presenting with back pain. The history is provided by the patient. No language interpreter was used.  Back Pain Location:  Lumbar spine and thoracic spine Quality:  Shooting Radiates to:  L posterior upper leg and R posterior upper leg Pain severity:  Severe Pain is:  Same all the time Timing:  Constant Progression:  Worsening Chronicity:  Chronic Relieved by:  Nothing Worsened by:  Movement, standing, ambulation and bending Ineffective treatments: BC powder. Associated symptoms: leg pain   Associated symptoms: no bladder incontinence and no bowel incontinence    [name] is a 44 y.o. male who presents to the ED with back pain. He has a hx of chronic back pain he was evaluated here in December and Jan for pain. It was discussed with him that he would need to have his PCP treat his chronic pain or follow up with a pain management clinic. Patient is here today and states that two days ago he was lifting wood and his back started hurting worse.   Past Medical History  Diagnosis Date  . Other, mixed, or unspecified nondependent drug abuse, unspecified   . Personal history of unspecified digestive disease   . Abdominal pain, unspecified site   . Bipolar disorder, unspecified   . Backache, unspecified   . Tobacco use disorder   . Crohn's   . Chronic back pain   . Shortness of breath     exertion  . GERD (gastroesophageal reflux disease)   . IBS (irritable bowel syndrome)   . Depression   . Anxiety    Past Surgical History  Procedure Laterality Date  . Right inguinal hernia repair    . Hernia repair    . Inguinal hernia repair Left 06/27/2012    Procedure: HERNIA REPAIR INGUINAL  ADULT;  Surgeon: Dalia Heading, MD;  Location: [facility];  Service: General;  Laterality: Left;   Family History  Problem Relation Age of Onset  . Lung cancer Father   . Cancer Father     lung  . Heart disease Mother    History  Substance Use Topics  . Smoking status: Current Every Day Smoker -- 1.00 packs/day for 25 years    Types: Cigarettes  . Smokeless tobacco: Not on file  . Alcohol Use: Yes    Review of Systems  Gastrointestinal: Negative for bowel incontinence.  Genitourinary: Negative for bladder incontinence.  Musculoskeletal: Positive for back pain.  all other systems negative    Allergies  Fluoxetine hcl; Ketorolac tromethamine; Nsaids; and Ultram  Home Medications   Prior to Admission medications   Medication Sig Start Date End Date Taking? Authorizing Provider  acetaminophen (TYLENOL) 500 MG tablet Take 500 mg by mouth every 6 (six) hours as needed for mild pain.   Yes Historical Provider, MD  albuterol (PROVENTIL HFA) 108 (90 BASE) MCG/ACT inhaler Inhale 2 puffs into the lungs every 6 (six) hours as needed. for shortness of breath   Yes Historical Provider, MD  Aspirin-Salicylamide-Caffeine (BC HEADACHE POWDER PO) Take 1 packet by mouth daily as needed (back pain).   Yes Historical Provider, MD  diazepam (VALIUM) 5 MG tablet Take 5 mg by mouth 3 (three) times daily.  Yes Historical Provider, MD  cyclobenzaprine (FLEXERIL) 10 MG tablet Take 1 tablet (10 mg total) by mouth 2 (two) times daily as needed for muscle spasms. 04/09/14   Hope Orlene OchM Neese, NP  etodolac (LODINE) 200 MG capsule Take 1 capsule (200 mg total) by mouth every 8 (eight) hours. Patient not taking: Reported on 04/09/2014 01/11/13   Gilda Creasehristopher J Pollina, MD  methocarbamol (ROBAXIN) 500 MG tablet Take 1 tablet (500 mg total) by mouth 2 (two) times daily. Patient not taking: Reported on 04/09/2014 01/11/13   Gilda Creasehristopher J Pollina, MD  oxyCODONE-acetaminophen (PERCOCET) 7.5-325 MG per tablet Take 1-2 tablets by  mouth every 4 (four) hours as needed for pain. Patient not taking: Reported on 04/09/2014 06/27/12   Franky MachoMark Jenkins Md, MD  predniSONE (DELTASONE) 10 MG tablet Take 2 tablets (20 mg total) by mouth 2 (two) times daily with a meal. 04/09/14   Hope Orlene OchM Neese, NP  promethazine (PHENERGAN) 25 MG tablet Take 1 tablet (25 mg total) by mouth every 6 (six) hours as needed for nausea. Patient not taking: Reported on 04/09/2014 03/29/12   Sunnie NielsenBrian Opitz, MD   BP 127/82 mmHg  Pulse 58  Temp(Src) 97.3 F (36.3 C) (Oral)  Resp 16  Ht 6\' 1"  (1.854 m)  Wt 180 lb (81.647 kg)  BMI 23.75 kg/m2  SpO2 97% Physical Exam  Constitutional: He is oriented to person, place, and time. He appears well-developed and well-nourished. No distress.  HENT:  Head: Normocephalic and atraumatic.  Eyes: Right eye exhibits no discharge and no exudate. Right conjunctiva is not injected.  Right pupil opaque, chronic,  Neck: Normal range of motion. Neck supple.  Cardiovascular: Normal rate and regular rhythm.   Pulmonary/Chest: Effort normal. No respiratory distress. He has no wheezes. He has no rales.  Abdominal: Soft. Bowel sounds are normal. There is no tenderness.  Musculoskeletal: Normal range of motion. He exhibits no edema.       Lumbar back: He exhibits tenderness and spasm. He exhibits no deformity and normal pulse. Decreased range of motion: due to pain.       Back:  Steady gait without foot drag. Pedal pulses equal, patient is able to do SLR but complains of pain.   Neurological: He is alert and oriented to person, place, and time. He has normal strength. No cranial nerve deficit or sensory deficit. Coordination and gait normal.  Reflex Scores:      Bicep reflexes are 2+ on the right side and 2+ on the left side.      Brachioradialis reflexes are 2+ on the right side and 2+ on the left side.      Patellar reflexes are 2+ on the right side and 2+ on the left side.      Achilles reflexes are 2+ on the right side and 2+ on the  left side. Skin: Skin is warm and dry.  Psychiatric: He has a normal mood and affect. His behavior is normal.  Nursing note and vitals reviewed.   ED Course  Procedures  Toradol 30 mg IM, Zofran 4 mg ODT, Flexeril 10 mg PO, Percocet 5/325 x 1  MDM  44 y.o. male with chronic low back pain. Pain increased 2 days ago after picking up a pice of wood. Stable for d/c without focal neuro deficits. Will treat with muscle relaxants and steroids. Discussed with patient he will need to follow up with Dr. Sherwood GamblerFusco for chronic pain management. Patient agrees with plan. He will also follow up with Dr.  Haines for eye exam.   Final diagnoses:  Bilateral low back pain, with sciatica presence unspecified  Cataract of right eye      [facility], NP 04/09/14 1655  Donnetta Hutching, MD 04/10/14 0830

## 2014-04-09 NOTE — ED Notes (Signed)
Pt c/o chronic back pain from fall off ladder x 1 year ago. Pt states he lifted wood x 2 days ago and today pt c/o lower back pain radiating into both legs. Denies gi/gu sx.

## 2014-04-09 NOTE — Discharge Instructions (Signed)
It appears that your right eye has a cataract. You will need to follow up with Dr. Lita MainsHaines to have a full eye exam.  We are treating your back pain and muscle spasm. You will need to follow up with Dr. Sherwood GamblerFusco for your chronic pain.

## 2014-04-09 NOTE — ED Notes (Signed)
Lower thoracic to lumbar pain radiation bilaterally into posterior thighs to knees w/associated numbness/tingling.  States his legs almost almost give out at times.  Denies loss of bladder or bowel control.  States he reinjured back this winter trying to push a car out of ditch, then again 2 days ago lifting wood.  Has not had any films done since initial injury a year ago.

## 2014-04-09 NOTE — ED Notes (Signed)
Patient states he just started having abdominal pain and vomiting.  Noted small amount of clear sputum in trashcan w/small streak of blood.

## 2014-04-09 NOTE — ED Notes (Signed)
Patient with no complaints at this time. Respirations even and unlabored. Skin warm/dry. Discharge instructions reviewed with patient at this time. Patient given opportunity to voice concerns/ask questions. Patient discharged at this time and left Emergency Department with steady gait.   

## 2021-03-09 DIAGNOSIS — Z1152 Encounter for screening for COVID-19: Secondary | ICD-10-CM | POA: Diagnosis not present

## 2021-03-28 ENCOUNTER — Encounter (HOSPITAL_COMMUNITY): Payer: Self-pay

## 2021-03-28 ENCOUNTER — Other Ambulatory Visit: Payer: Self-pay

## 2021-03-28 ENCOUNTER — Emergency Department (HOSPITAL_COMMUNITY)
Admission: EM | Admit: 2021-03-28 | Discharge: 2021-03-28 | Discharge status: Against Medical Advice | Payer: Self-pay | Attending: Emergency Medicine | Admitting: Emergency Medicine

## 2021-03-28 ENCOUNTER — Emergency Department (HOSPITAL_COMMUNITY): Admission: RE | Admit: 2021-03-28 | Payer: Self-pay | Source: Ambulatory Visit

## 2021-03-28 DIAGNOSIS — D72829 Elevated white blood cell count, unspecified: Secondary | ICD-10-CM | POA: Insufficient documentation

## 2021-03-28 DIAGNOSIS — R1032 Left lower quadrant pain: Secondary | ICD-10-CM | POA: Insufficient documentation

## 2021-03-28 DIAGNOSIS — R109 Unspecified abdominal pain: Secondary | ICD-10-CM

## 2021-03-28 DIAGNOSIS — Z5329 Procedure and treatment not carried out because of patient's decision for other reasons: Secondary | ICD-10-CM | POA: Insufficient documentation

## 2021-03-28 DIAGNOSIS — R1031 Right lower quadrant pain: Secondary | ICD-10-CM | POA: Insufficient documentation

## 2021-03-28 DIAGNOSIS — R1012 Left upper quadrant pain: Secondary | ICD-10-CM | POA: Insufficient documentation

## 2021-03-28 LAB — LIPASE, BLOOD: Lipase: 41 U/L (ref 11–51)

## 2021-03-28 LAB — COMPREHENSIVE METABOLIC PANEL
ALT: 28 U/L (ref 0–44)
AST: 24 U/L (ref 15–41)
Albumin: 4.2 g/dL (ref 3.5–5.0)
Alkaline Phosphatase: 73 U/L (ref 38–126)
Anion gap: 8 (ref 5–15)
BUN: 18 mg/dL (ref 6–20)
CO2: 27 mmol/L (ref 22–32)
Calcium: 9 mg/dL (ref 8.9–10.3)
Chloride: 106 mmol/L (ref 98–111)
Creatinine, Ser: 0.76 mg/dL (ref 0.61–1.24)
GFR, Estimated: >60 mL/min (ref >60)
Glucose, Bld: 88 mg/dL (ref 70–99)
Potassium: 4.6 mmol/L (ref 3.5–5.1)
Sodium: 141 mmol/L (ref 135–145)
Total Bilirubin: 0.5 mg/dL (ref 0.3–1.2)
Total Protein: 7.7 g/dL (ref 6.5–8.1)

## 2021-03-28 LAB — CBC
HCT: 44.9 % (ref 39.0–52.0)
Hemoglobin: 15 g/dL (ref 13.0–17.0)
MCH: 30.7 pg (ref 26.0–34.0)
MCHC: 33.4 g/dL (ref 30.0–36.0)
MCV: 91.8 fL (ref 80.0–100.0)
Platelets: 284 10*3/uL (ref 150–400)
RBC: 4.89 MIL/uL (ref 4.22–5.81)
RDW: 12.3 % (ref 11.5–15.5)
WBC: 11.4 10*3/uL — ABNORMAL HIGH (ref 4.0–10.5)
nRBC: 0 % (ref 0.0–0.2)

## 2021-03-28 MED ORDER — SODIUM CHLORIDE 0.9 % IV BOLUS
1000.0000 mL | Freq: Once | INTRAVENOUS | Status: AC
  Administered 2021-03-28: 1000 mL via INTRAVENOUS

## 2021-03-28 MED ORDER — HYDROMORPHONE HCL 1 MG/ML IJ SOLN
1.0000 mg | Freq: Once | INTRAMUSCULAR | Status: AC
  Administered 2021-03-28: 1 mg via INTRAVENOUS
  Filled 2021-03-28: qty 1

## 2021-03-28 MED ORDER — ONDANSETRON HCL 4 MG/2ML IJ SOLN
4.0000 mg | Freq: Once | INTRAMUSCULAR | Status: AC
  Administered 2021-03-28: 4 mg via INTRAVENOUS
  Filled 2021-03-28: qty 2

## 2021-03-28 NOTE — ED Triage Notes (Signed)
Pt reports "extreme gas and belching" for past 3 or 4 days.  Reports ate some chicken last and says started feeling worse.  LBM was normal.  Reports nausea, no vomiting.   C/O abd pain.  ?

## 2021-03-28 NOTE — ED Notes (Signed)
Pt requested nurse. Upon arrival pt requested to have IV removed because he was leaving. Nurse asked why and he said " take it out'. Nurse removed IV and pt signed AMA.  ?

## 2021-03-28 NOTE — ED Notes (Signed)
MD notified  

## 2021-03-28 NOTE — ED Provider Notes (Signed)
?[facility] ?Provider Note ? ? ?CSN: [id number] ?Arrival date & time: 03/28/21  1012 ? ?  ? ?History ? ?Chief Complaint  ?Patient presents with  ? Abdominal Pain  ? ? ?[name] is a 51 y.o. male. ? ?HPI ?51 year old male presents with severe abdominal pain.  Originally started about 3 or 4 days ago.  Got a lot worse after eating chicken last night.  No diarrhea but he has felt constipated.  He had a bowel movement this morning which minimally helped his discomfort.  He is been having some burning that feels like prior reflux and he took some Protonix and Tums but most of his pain is lower abdominal.  Nausea without vomiting.  He has not had a fever. ? ?In the past he was diagnosed with Crohn's, which she states was about 20 years ago.  He states he thinks he has IBS but does not take any regular medicines for these. ? ?Home Medications ?Prior to Admission medications   ?Medication Sig Start Date End Date Taking? Authorizing Provider  ?acetaminophen (TYLENOL) 500 MG tablet Take 500 mg by mouth every 6 (six) hours as needed for mild pain.    [provider]  ?albuterol (PROVENTIL HFA) 108 (90 BASE) MCG/ACT inhaler Inhale 2 puffs into the lungs every 6 (six) hours as needed. for shortness of breath    [provider]  ?Aspirin-Salicylamide-Caffeine (BC HEADACHE POWDER PO) Take 1 packet by mouth daily as needed (back pain).    [provider]  ?cyclobenzaprine (FLEXERIL) 10 MG tablet Take 1 tablet (10 mg total) by mouth 2 (two) times daily as needed for muscle spasms. 04/09/14   Janne Napoleon, NP  ?diazepam (VALIUM) 5 MG tablet Take 5 mg by mouth 3 (three) times daily.    [provider]  ?etodolac (LODINE) 200 MG capsule Take 1 capsule (200 mg total) by mouth every 8 (eight) hours. ?Patient not taking: Reported on 04/09/2014 01/11/13   Gilda Crease, MD  ?methocarbamol (ROBAXIN) 500 MG tablet Take 1 tablet (500 mg total) by mouth 2 (two) times  daily. ?Patient not taking: Reported on 04/09/2014 01/11/13   Gilda Crease, MD  ?oxyCODONE-acetaminophen (PERCOCET) 7.5-325 MG per tablet Take 1-2 tablets by mouth every 4 (four) hours as needed for pain. ?Patient not taking: Reported on 04/09/2014 06/27/12   Franky Macho, MD  ?predniSONE (DELTASONE) 10 MG tablet Take 2 tablets (20 mg total) by mouth 2 (two) times daily with a meal. 04/09/14   Janne Napoleon, NP  ?promethazine (PHENERGAN) 25 MG tablet Take 1 tablet (25 mg total) by mouth every 6 (six) hours as needed for nausea. ?Patient not taking: Reported on 04/09/2014 03/29/12   Sunnie Nielsen, MD  ?   ? ?Allergies    ?Fluoxetine hcl, Ketorolac tromethamine, Nsaids, and Ultram [tramadol hcl]   ? ?Review of Systems   ?Review of Systems  ?Constitutional:  Negative for fever.  ?Gastrointestinal:  Positive for abdominal pain, constipation and nausea. Negative for diarrhea and vomiting.  ? ?Physical Exam ?Updated Vital Signs ?BP (!) 116/54   Pulse (!) 54   Temp 98.1 ?F (36.7 ?C) (Oral) Comment: pt states that if his temp is more then 97.0 then pt is runny a slight fever  Resp 17   Ht 6' (1.829 m)   Wt 83 kg   SpO2 100%   BMI 24.82 kg/m?  ?Physical Exam ?Vitals and nursing note reviewed.  ?Constitutional:   ?   Appearance:  He is well-developed.  ?   Comments: Patient appears in significant pain  ?HENT:  ?   Head: Normocephalic and atraumatic.  ?Cardiovascular:  ?   Rate and Rhythm: Normal rate and regular rhythm.  ?   Heart sounds: Normal heart sounds.  ?Pulmonary:  ?   Effort: Pulmonary effort is normal.  ?   Breath sounds: Normal breath sounds.  ?Abdominal:  ?   Palpations: Abdomen is soft.  ?   Tenderness: There is abdominal tenderness in the right lower quadrant, suprapubic area, left upper quadrant and left lower quadrant.  ?Skin: ?   General: Skin is warm and dry.  ?Neurological:  ?   Mental Status: He is alert.  ? ? ?ED Results / Procedures / Treatments   ?Labs ?(all labs ordered are listed, but only  abnormal results are displayed) ?Labs Reviewed  ?CBC - Abnormal; Notable for the following components:  ?    Result Value  ? WBC 11.4 (*)   ? All other components within normal limits  ?LIPASE, BLOOD  ?COMPREHENSIVE METABOLIC PANEL  ? ? ?EKG ?None ? ?Radiology ?No results found. ? ?Procedures ?Procedures  ? ? ?Medications Ordered in ED ?Medications  ?sodium chloride 0.9 % bolus 1,000 mL (0 mLs Intravenous Stopped 03/28/21 1229)  ?HYDROmorphone (DILAUDID) injection 1 mg (1 mg Intravenous Given 03/28/21 1152)  ?ondansetron (ZOFRAN) injection 4 mg (4 mg Intravenous Given 03/28/21 1151)  ? ? ?ED Course/ Medical Decision Making/ A&P ?  ?                        ?Medical Decision Making ?Amount and/or Complexity of Data Reviewed ?Labs: ordered. ?Radiology: ordered. ? ?Risk ?Prescription drug management. ? ? ?Patient's labs shows mild leukocytosis. I am most concerned about diverticulitis based on location of pain. However, after receiving dilaudid and prior to CT, he wanted to leave. He was signed out AMA by the nurse, but I was not made aware of this until after he had already left.  ? ? ? ? ? ? ? ?Final Clinical Impression(s) / ED Diagnoses ?Final diagnoses:  ?None  ? ? ?Rx / DC Orders ?ED Discharge Orders   ? ? None  ? ?  ? ? ?  ?Pricilla Loveless, MD ?03/28/21 1810 ? ?

## 2021-04-20 DIAGNOSIS — Z1152 Encounter for screening for COVID-19: Secondary | ICD-10-CM | POA: Diagnosis not present

## 2023-01-07 ENCOUNTER — Emergency Department (HOSPITAL_COMMUNITY): Payer: Self-pay

## 2023-01-07 ENCOUNTER — Other Ambulatory Visit: Payer: Self-pay

## 2023-01-07 ENCOUNTER — Encounter (HOSPITAL_COMMUNITY): Payer: Self-pay | Admitting: *Deleted

## 2023-01-07 ENCOUNTER — Emergency Department (HOSPITAL_COMMUNITY)
Admission: EM | Admit: 2023-01-07 | Discharge: 2023-01-07 | Disposition: A | Discharge status: Home / Self Care | Payer: Self-pay | Attending: Emergency Medicine | Admitting: Emergency Medicine

## 2023-01-07 DIAGNOSIS — Z7982 Long term (current) use of aspirin: Secondary | ICD-10-CM | POA: Insufficient documentation

## 2023-01-07 DIAGNOSIS — K5900 Constipation, unspecified: Secondary | ICD-10-CM | POA: Insufficient documentation

## 2023-01-07 DIAGNOSIS — K0889 Other specified disorders of teeth and supporting structures: Secondary | ICD-10-CM

## 2023-01-07 DIAGNOSIS — K029 Dental caries, unspecified: Secondary | ICD-10-CM | POA: Insufficient documentation

## 2023-01-07 DIAGNOSIS — R0789 Other chest pain: Secondary | ICD-10-CM | POA: Diagnosis not present

## 2023-01-07 DIAGNOSIS — R1032 Left lower quadrant pain: Secondary | ICD-10-CM | POA: Insufficient documentation

## 2023-01-07 LAB — BASIC METABOLIC PANEL
Anion gap: 7 (ref 5–15)
BUN: 11 mg/dL (ref 6–20)
CO2: 26 mmol/L (ref 22–32)
Calcium: 9.4 mg/dL (ref 8.9–10.3)
Chloride: 106 mmol/L (ref 98–111)
Creatinine, Ser: 0.61 mg/dL (ref 0.61–1.24)
GFR, Estimated: >60 mL/min (ref >60)
Glucose, Bld: 100 mg/dL — ABNORMAL HIGH (ref 70–99)
Potassium: 4.5 mmol/L (ref 3.5–5.1)
Sodium: 139 mmol/L (ref 135–145)

## 2023-01-07 LAB — CBC
HCT: 42.1 % (ref 39.0–52.0)
Hemoglobin: 13.6 g/dL (ref 13.0–17.0)
MCH: 28.8 pg (ref 26.0–34.0)
MCHC: 32.3 g/dL (ref 30.0–36.0)
MCV: 89 fL (ref 80.0–100.0)
Platelets: 273 10*3/uL (ref 150–400)
RBC: 4.73 MIL/uL (ref 4.22–5.81)
RDW: 12.4 % (ref 11.5–15.5)
WBC: 7.2 10*3/uL (ref 4.0–10.5)
nRBC: 0 % (ref 0.0–0.2)

## 2023-01-07 LAB — URINALYSIS, ROUTINE W REFLEX MICROSCOPIC
Bilirubin Urine: NEGATIVE
Glucose, UA: NEGATIVE mg/dL
Hgb urine dipstick: NEGATIVE
Ketones, ur: NEGATIVE mg/dL
Leukocytes,Ua: NEGATIVE
Nitrite: NEGATIVE
Protein, ur: NEGATIVE mg/dL
Specific Gravity, Urine: 1.008 (ref 1.005–1.030)
pH: 6 (ref 5.0–8.0)

## 2023-01-07 LAB — TROPONIN I (HIGH SENSITIVITY)
Troponin I (High Sensitivity): 3 ng/L (ref <18)
Troponin I (High Sensitivity): 3 ng/L (ref <18)

## 2023-01-07 LAB — LIPASE, BLOOD: Lipase: 26 U/L (ref 11–51)

## 2023-01-07 LAB — HEPATIC FUNCTION PANEL
ALT: 17 U/L (ref 0–44)
AST: 19 U/L (ref 15–41)
Albumin: 4 g/dL (ref 3.5–5.0)
Alkaline Phosphatase: 71 U/L (ref 38–126)
Bilirubin, Direct: <0.1 mg/dL (ref 0.0–0.2)
Total Bilirubin: 0.4 mg/dL (ref 0.0–1.2)
Total Protein: 7.6 g/dL (ref 6.5–8.1)

## 2023-01-07 MED ORDER — AMOXICILLIN 500 MG PO CAPS: 500.0000 mg | ORAL_CAPSULE | Freq: Three times a day (TID) | ORAL | 0 refills | Status: AC

## 2023-01-07 MED ORDER — ONDANSETRON HCL 4 MG/2ML IJ SOLN: 4.0000 mg | Freq: Once | INTRAMUSCULAR | Status: DC

## 2023-01-07 MED ORDER — LIDOCAINE VISCOUS HCL 2 % MT SOLN
15.0000 mL | Freq: Once | OROMUCOSAL | Status: AC
  Administered 2023-01-07: 15 mL via ORAL
  Filled 2023-01-07: qty 15

## 2023-01-07 MED ORDER — MORPHINE SULFATE (PF) 4 MG/ML IV SOLN
4.0000 mg | Freq: Once | INTRAVENOUS | Status: AC
  Administered 2023-01-07: 4 mg via INTRAVENOUS
  Filled 2023-01-07: qty 1

## 2023-01-07 MED ORDER — IOHEXOL 300 MG/ML  SOLN
100.0000 mL | Freq: Once | INTRAMUSCULAR | Status: AC | PRN
  Administered 2023-01-07: 100 mL via INTRAVENOUS

## 2023-01-07 MED ORDER — SENNOSIDES-DOCUSATE SODIUM 8.6-50 MG PO TABS: 1.0000 | ORAL_TABLET | Freq: Every day | ORAL | 0 refills | Status: AC

## 2023-01-07 MED ORDER — ONDANSETRON HCL 4 MG/2ML IJ SOLN
4.0000 mg | Freq: Once | INTRAMUSCULAR | Status: AC
  Administered 2023-01-07: 4 mg via INTRAVENOUS
  Filled 2023-01-07: qty 2

## 2023-01-07 MED ORDER — HYDROCODONE-ACETAMINOPHEN 5-325 MG PO TABS: 1.0000 | ORAL_TABLET | Freq: Four times a day (QID) | ORAL | 0 refills | Status: DC | PRN

## 2023-01-07 MED ORDER — ALUM & MAG HYDROXIDE-SIMETH 200-200-20 MG/5ML PO SUSP
30.0000 mL | Freq: Once | ORAL | Status: AC
  Administered 2023-01-07: 30 mL via ORAL
  Filled 2023-01-07: qty 30

## 2023-01-07 MED ORDER — POLYETHYLENE GLYCOL 3350 17 G PO PACK: 17.0000 g | PACK | Freq: Every day | ORAL | 0 refills | Status: AC

## 2023-01-07 NOTE — ED Triage Notes (Signed)
Pt with mid CP x 2 hours, pt came to be seen for dental pain but CP on the way.  Denies any SOB or N/V.

## 2023-01-07 NOTE — Discharge Instructions (Addendum)
You are seen in the emergency room today for dental pain.  Please return to emergency room with new or worsening symptoms.  Please take Tylenol and ibuprofen for pain. You can use Norco for breakthrough pain at home. I have also sent amoxicillin to your pharmacy.  Please call dentist and follow-up as soon as possible.   Medications to pharmacy for constipation.  Please take MiraLAX for the next 2 to 3 days. Please follow up with GI doctor of history of crohns.

## 2023-01-07 NOTE — ED Provider Notes (Signed)
EMERGENCY DEPARTMENT AT [facility] Provider Note   CSN: [id number] Arrival date & time: 01/07/23  1231     History  Chief Complaint  Patient presents with   Chest Pain    [name] is a 52 y.o. male patient with bipolar disorder, generalized anxiety disorder, substance abuse, Crohn's disease presenting to emergency room with complaint of left lower abdominal pain, diarrhea for about a month. No blood in stool. Patient reports his symptoms have been getting worse, has not tried anything for symptoms.  Also complains of dental pain, dental infection and loose teeth. Ongoing for months. Reports it hurts to eat and he knows he needs to see a dentist. Patient reports he has had some reflux symptoms, and upset stomach radiating to chest, but denies chest pain to me although mentioned is triage not.  Denies current chest pain, shortness of breath, cough, NV Denies blood in stool. Denies fever or chills.    Chest Pain Associated symptoms: abdominal pain        Home Medications Prior to Admission medications   Medication Sig Start Date End Date Taking? Authorizing Provider  acetaminophen (TYLENOL) 500 MG tablet Take 500 mg by mouth every 6 (six) hours as needed for mild pain.    [provider]  albuterol (PROVENTIL HFA) 108 (90 BASE) MCG/ACT inhaler Inhale 2 puffs into the lungs every 6 (six) hours as needed. for shortness of breath    [provider]  Aspirin-Salicylamide-Caffeine (BC HEADACHE POWDER PO) Take 1 packet by mouth daily as needed (back pain).    [provider]  cyclobenzaprine (FLEXERIL) 10 MG tablet Take 1 tablet (10 mg total) by mouth 2 (two) times daily as needed for muscle spasms. 04/09/14   Janne Napoleon, NP  diazepam (VALIUM) 5 MG tablet Take 5 mg by mouth 3 (three) times daily.    [provider]  etodolac (LODINE) 200 MG capsule Take 1 capsule (200 mg total) by mouth every 8 (eight) hours. Patient not taking:  Reported on 04/09/2014 01/11/13   Gilda Crease, MD  methocarbamol (ROBAXIN) 500 MG tablet Take 1 tablet (500 mg total) by mouth 2 (two) times daily. Patient not taking: Reported on 04/09/2014 01/11/13   Gilda Crease, MD  oxyCODONE-acetaminophen (PERCOCET) 7.5-325 MG per tablet Take 1-2 tablets by mouth every 4 (four) hours as needed for pain. Patient not taking: Reported on 04/09/2014 06/27/12   Franky Macho, MD  predniSONE (DELTASONE) 10 MG tablet Take 2 tablets (20 mg total) by mouth 2 (two) times daily with a meal. 04/09/14   Janne Napoleon, NP  promethazine (PHENERGAN) 25 MG tablet Take 1 tablet (25 mg total) by mouth every 6 (six) hours as needed for nausea. Patient not taking: Reported on 04/09/2014 03/29/12   Sunnie Nielsen, MD      Allergies    Fluoxetine hcl, Ketorolac tromethamine, Nsaids, and Ultram [tramadol hcl]    Review of Systems   Review of Systems  HENT:  Positive for dental problem.   Cardiovascular:  Negative for chest pain.  Gastrointestinal:  Positive for abdominal pain.    Physical Exam Updated Vital Signs BP 128/82 (BP Location: Right Arm)   Pulse 68   Temp 98.6 F (37 C) (Oral)   Resp 16   Ht 6' (1.829 m)   Wt 79.4 kg   SpO2 100%   BMI 23.73 kg/m  Physical Exam Vitals and nursing note reviewed.  Constitutional:  General: He is not in acute distress.    Appearance: He is not toxic-appearing.  HENT:     Head: Normocephalic and atraumatic.     Mouth/Throat:     Dentition: Abnormal dentition. Does not have dentures. Dental caries and gum lesions present. No gingival swelling or dental abscesses.     Tongue: No lesions.     Pharynx: Oropharynx is clear. Uvula midline. No pharyngeal swelling.     Tonsils: No tonsillar exudate or tonsillar abscesses.  Eyes:     General: No scleral icterus.    Conjunctiva/sclera: Conjunctivae normal.  Cardiovascular:     Rate and Rhythm: Normal rate and regular rhythm.     Pulses: Normal pulses.     Heart  sounds: Normal heart sounds.  Pulmonary:     Effort: Pulmonary effort is normal. No respiratory distress.     Breath sounds: Normal breath sounds.  Abdominal:     General: Abdomen is flat. Bowel sounds are normal.     Palpations: Abdomen is soft.     Tenderness: There is abdominal tenderness.     Comments: LLQ pain  Skin:    General: Skin is warm and dry.     Findings: No lesion.  Neurological:     General: No focal deficit present.     Mental Status: He is alert and oriented to person, place, and time. Mental status is at baseline.     ED Results / Procedures / Treatments   Labs (all labs ordered are listed, but only abnormal results are displayed) Labs Reviewed  BASIC METABOLIC PANEL - Abnormal; Notable for the following components:      Result Value   Glucose, Bld 100 (*)    All other components within normal limits  URINALYSIS, ROUTINE W REFLEX MICROSCOPIC - Abnormal; Notable for the following components:   Color, Urine STRAW (*)    All other components within normal limits  CBC  HEPATIC FUNCTION PANEL  LIPASE, BLOOD  TROPONIN I (HIGH SENSITIVITY)  TROPONIN I (HIGH SENSITIVITY)    EKG EKG Interpretation Date/Time:  Monday January 07 2023 13:14:00 EST Ventricular Rate:  64 PR Interval:  138 QRS Duration:  102 QT Interval:  414 QTC Calculation: 427 R Axis:   61  Text Interpretation: Normal sinus rhythm Normal ECG When compared with ECG of 29-Jul-2007 14:11, No significant change was found Confirmed by Eber Hong ([id number]) on 01/07/2023 1:42:36 PM  Radiology CT ABDOMEN PELVIS W CONTRAST Result Date: 01/07/2023 CLINICAL DATA:  Abdominal pain, possible Crohn's exacerbation EXAM: CT ABDOMEN AND PELVIS WITH CONTRAST TECHNIQUE: Multidetector CT imaging of the abdomen and pelvis was performed using the standard protocol following bolus administration of intravenous contrast. RADIATION DOSE REDUCTION: This exam was performed according to the departmental  dose-optimization program which includes automated exposure control, adjustment of the mA and/or kV according to patient size and/or use of iterative reconstruction technique. CONTRAST:  OMNIPAQUE IOHEXOL 300 MG/ML  SOLN COMPARISON:  02/27/2012 FINDINGS: Lower chest: No acute abnormality. Hepatobiliary: No focal liver abnormality is seen. No gallstones, gallbladder wall thickening, or biliary dilatation. Pancreas: Unremarkable. No pancreatic ductal dilatation or surrounding inflammatory changes. Spleen: Normal in size without focal abnormality. Adrenals/Urinary Tract: Adrenal glands are within normal limits. Kidneys demonstrate bilateral simple renal cysts. No further follow-up is recommended. No renal calculi or obstructive changes are noted. The bladder is partially distended. Stomach/Bowel: Mild retained fecal material is noted throughout the colon without wall thickening or obstructive change. The appendix is within normal limits.  Small bowel and stomach are unremarkable. Vascular/Lymphatic: Aortic atherosclerosis. No enlarged abdominal or pelvic lymph nodes. Reproductive: Prostate is unremarkable. Other: No abdominal wall hernia or abnormality. No abdominopelvic ascites. Musculoskeletal: No acute or significant osseous findings. IMPRESSION: Mild colonic constipation without obstructive change. No other focal abnormality is noted. Electronically Signed   By: Alcide Clever M.D.   On: 01/07/2023 22:17   DG Chest 2 View Result Date: 01/07/2023 CLINICAL DATA:  Chest pain. EXAM: CHEST - 2 VIEW COMPARISON:  12/19/2007. FINDINGS: Bilateral lung fields are clear. Bilateral costophrenic angles are clear. Normal cardio-mediastinal silhouette. No acute osseous abnormalities. The soft tissues are within normal limits. IMPRESSION: No active cardiopulmonary disease. Electronically Signed   By: Jules Schick M.D.   On: 01/07/2023 14:35    Procedures Procedures    Medications Ordered in ED Medications - No  data to display  ED Course/ Medical Decision Making/ A&P                                 Medical Decision Making Amount and/or Complexity of Data Reviewed Labs: ordered. Radiology: ordered.  Risk OTC drugs. Prescription drug management.   Sheran Fava Voght 52 y.o. presented today for abd pain. Working DDx includes, but not limited to, gastroenteritis, colitis, SBO, appendicitis, cholecystitis, hepatobiliary pathology, gastritis, PUD, ACS, dissection, pancreatitis, nephrolithiasis, AAA, UTI, pyelonephritis, testicular torsion.  R/o DDx: These are considered less likely than current impression due to history of present illness, physical exam, labs/imaging findings.  Review of prior external notes: None  Pmhx: Crohn's    Unique Tests and My Interpretation:  CBC:  No leukocytosis, no anemia  BMP: no electrolyte abnormality   Hepatic function panel: wnl Lipase: 26 UA: wnl  EKG: Rate, rhythm, axis, intervals all examined: normal sinus   Imaging:  CT Abd/Pelvis with contrast: evaluate for structural/surgical etiology of patients' severe abdominal pain which show mild constipation, no obstruction.  Chest x-ray without abnormality.    Problem List / ED Course / Critical interventions / Medication management  No sign of ludwig's angina, no difficulty handling secretions, normal phonation. Has chronic appearing poor dentition. Vitals stable. Will send with antibiotics and dental resources. Patient also reports chronic abdominal pain, worse over the past month, LLQ tenderness on exam. Will eval with labs and scan. Patient reports referred pain to upper epigastric area and reflex. Denies chest pain, but did have x2 negative trops, normal EKG, normal chest x-ray. These symptoms have resolved since arriving. Normal CT abd/pelvis. Tolerating oral intake. Hemodynamically stable, well appearing, stable for discharge. Establish with PCP for follow up.  I ordered medication including morphine, zofran   Reevaluation of the patient after these medicines showed that the patient improved Patients vitals assessed. Upon arrival patient is hemodynamically stable.  I have reviewed the patients home medicines and have made adjustments as needed   Consult: none   Plan:  F/u w/ PCP in 2-3d to ensure resolution of sx.  Patient was given return precautions. Patient stable for discharge at this time.  Patient educated on current sx/dx and verbalized understanding of plan. Return to ER w/ new or worsening sx.          Final Clinical Impression(s) / ED Diagnoses Final diagnoses:  Pain, dental  Constipation, unspecified constipation type    Rx / DC Orders ED Discharge Orders          Ordered    amoxicillin (AMOXIL) 500 MG  capsule  3 times daily        01/07/23 2222    polyethylene glycol (MIRALAX) 17 g packet  Daily        01/07/23 2222    senna-docusate (SENOKOT-S) 8.6-50 MG tablet  Daily        01/07/23 2222    HYDROcodone-acetaminophen (NORCO/VICODIN) 5-325 MG tablet  Every 6 hours PRN        01/07/23 2234              Reese Stockman, Horald Chestnut, PA-C 01/07/23 2355    Gloris Manchester, MD 01/08/23 [phone]

## 2023-01-07 NOTE — ED Notes (Signed)
Patient  states that he is feeling , no complaints of chest pain at present. He states " I burped and the pain went away."

## 2023-02-21 ENCOUNTER — Other Ambulatory Visit: Payer: Self-pay

## 2023-02-21 ENCOUNTER — Emergency Department (HOSPITAL_COMMUNITY)
Admission: EM | Admit: 2023-02-21 | Discharge: 2023-02-21 | Disposition: A | Discharge status: Home / Self Care | Payer: 59 | Attending: Emergency Medicine | Admitting: Emergency Medicine

## 2023-02-21 ENCOUNTER — Encounter (HOSPITAL_COMMUNITY): Payer: Self-pay | Admitting: Emergency Medicine

## 2023-02-21 DIAGNOSIS — M7918 Myalgia, other site: Secondary | ICD-10-CM | POA: Insufficient documentation

## 2023-02-21 DIAGNOSIS — R252 Cramp and spasm: Secondary | ICD-10-CM

## 2023-02-21 LAB — BASIC METABOLIC PANEL
Anion gap: 9 (ref 5–15)
BUN: 18 mg/dL (ref 6–20)
CO2: 29 mmol/L (ref 22–32)
Calcium: 9.4 mg/dL (ref 8.9–10.3)
Chloride: 103 mmol/L (ref 98–111)
Creatinine, Ser: 0.66 mg/dL (ref 0.61–1.24)
GFR, Estimated: >60 mL/min (ref >60)
Glucose, Bld: 88 mg/dL (ref 70–99)
Potassium: 3.8 mmol/L (ref 3.5–5.1)
Sodium: 141 mmol/L (ref 135–145)

## 2023-02-21 LAB — MAGNESIUM: Magnesium: 2.1 mg/dL (ref 1.7–2.4)

## 2023-02-21 MED ORDER — AMOXICILLIN-POT CLAVULANATE 875-125 MG PO TABS: 1.0000 | ORAL_TABLET | Freq: Two times a day (BID) | ORAL | 0 refills | Status: AC

## 2023-02-21 MED ORDER — METHOCARBAMOL 500 MG PO TABS: 500.0000 mg | ORAL_TABLET | Freq: Two times a day (BID) | ORAL | 0 refills | Status: DC

## 2023-02-21 MED ORDER — DIAZEPAM 5 MG PO TABS
5.0000 mg | ORAL_TABLET | ORAL | Status: AC
  Administered 2023-02-21: 5 mg via ORAL
  Filled 2023-02-21: qty 1

## 2023-02-21 NOTE — ED Triage Notes (Signed)
Pt c/o leg cramps that started tonight. States that he hasn't had leg cramps in over a year, states that he used to have cramps for "3 years when he was drinking city water." Pt ambulatory to triage.

## 2023-02-21 NOTE — ED Notes (Signed)
Patient approached nurse's station and states "he needs his medications because he is feeling really anxious".

## 2023-02-21 NOTE — Discharge Instructions (Signed)
You were seen for your muscle cramps and loose teeth in the emergency department.   At home, please stay well-hydrated to prevent muscle cramps.  Dr. Your primary doctor to see if you need any additional medication for anxiety.  Follow-up with dentistry about your teeth.  Take the antibiotics to prevent infection of your teeth.  Check your MyChart online for the results of any tests that had not resulted by the time you left the emergency department.   Follow-up with your primary doctor in 2-3 days regarding your visit.    Return immediately to the emergency department if you experience any concerning symptoms  Thank you for visiting our Emergency Department. It was a pleasure taking care of you today.

## 2023-02-21 NOTE — ED Provider Notes (Incomplete)
[place] EMERGENCY DEPARTMENT AT [facility] Provider Note   CSN: [id number] Arrival date & time: 02/21/23  1848     History {Add pertinent medical, surgical, social history, OB history to HPI:1} Chief Complaint  Patient presents with   Leg cramps    [name] is a 53 y.o. male.  53 year old male with a history of substance use, EtOH use, and generalized anxiety presents emergency department with muscle spasms.  Patient reports for the past 3 years she has been having muscle spasms.  Says that in the past 24 hours has been having severe spasms in his left leg as well as both of his arms.  Mostly in his quadricep.  Says that he thinks that drinking bottled water may be associated with it.  Unsure why he is having the spasms.  Also feels very anxious at this time and is requesting Valium.  Also says that he has several loose teeth but no significant pain.  No fevers.  Denies any recent alcohol or substance use.       Home Medications Prior to Admission medications   Medication Sig Start Date End Date Taking? Authorizing Provider  acetaminophen (TYLENOL) 500 MG tablet Take 500 mg by mouth every 6 (six) hours as needed for mild pain.    [provider]  albuterol (PROVENTIL HFA) 108 (90 BASE) MCG/ACT inhaler Inhale 2 puffs into the lungs every 6 (six) hours as needed. for shortness of breath    [provider]  amoxicillin (AMOXIL) 500 MG capsule Take 1 capsule (500 mg total) by mouth 3 (three) times daily. 01/07/23   Barrett, Horald Chestnut, PA-C  Aspirin-Salicylamide-Caffeine (BC HEADACHE POWDER PO) Take 1 packet by mouth daily as needed (back pain).    [provider]  cyclobenzaprine (FLEXERIL) 10 MG tablet Take 1 tablet (10 mg total) by mouth 2 (two) times daily as needed for muscle spasms. 04/09/14   Janne Napoleon, NP  diazepam (VALIUM) 5 MG tablet Take 5 mg by mouth 3 (three) times daily.    [provider]  etodolac (LODINE) 200 MG capsule  Take 1 capsule (200 mg total) by mouth every 8 (eight) hours. Patient not taking: Reported on 04/09/2014 01/11/13   Gilda Crease, MD  HYDROcodone-acetaminophen (NORCO/VICODIN) 5-325 MG tablet Take 1 tablet by mouth every 6 (six) hours as needed for severe pain (pain score 7-10). 01/07/23   Barrett, Horald Chestnut, PA-C  methocarbamol (ROBAXIN) 500 MG tablet Take 1 tablet (500 mg total) by mouth 2 (two) times daily. Patient not taking: Reported on 04/09/2014 01/11/13   Gilda Crease, MD  oxyCODONE-acetaminophen (PERCOCET) 7.5-325 MG per tablet Take 1-2 tablets by mouth every 4 (four) hours as needed for pain. Patient not taking: Reported on 04/09/2014 06/27/12   Franky Macho, MD  polyethylene glycol (MIRALAX) 17 g packet Take 17 g by mouth daily. 01/07/23   Barrett, Horald Chestnut, PA-C  predniSONE (DELTASONE) 10 MG tablet Take 2 tablets (20 mg total) by mouth 2 (two) times daily with a meal. 04/09/14   Damian Leavell, [place], NP  promethazine (PHENERGAN) 25 MG tablet Take 1 tablet (25 mg total) by mouth every 6 (six) hours as needed for nausea. Patient not taking: Reported on 04/09/2014 03/29/12   Sunnie Nielsen, MD  senna-docusate (SENOKOT-S) 8.6-50 MG tablet Take 1 tablet by mouth daily. 01/07/23   Barrett, Horald Chestnut, PA-C      Allergies    Fluoxetine hcl, Ketorolac tromethamine, Nsaids, and Ultram [tramadol hcl]  Review of Systems   Review of Systems  Physical Exam Updated Vital Signs BP (!) 147/84   Pulse 88   Temp 98.2 F (36.8 C)   Resp 18   Ht 6' (1.829 m)   Wt 79 kg   SpO2 99%   BMI 23.62 kg/m  Physical Exam Vitals and nursing note reviewed.  Constitutional:      General: He is not in acute distress.    Appearance: He is well-developed.  HENT:     Head: Normocephalic and atraumatic.     Right Ear: External ear normal.     Left Ear: External ear normal.     Nose: Nose normal.     Mouth/Throat:     Comments: Poor dentition.  Missing most of his teeth.  Central incisors are loose.  No  abscess palpable.  No trismus.  No brawny edema under the tongue. Eyes:     Extraocular Movements: Extraocular movements intact.     Conjunctiva/sclera: Conjunctivae normal.     Pupils: Pupils are equal, round, and reactive to light.  Pulmonary:     Effort: Pulmonary effort is normal. No respiratory distress.  Musculoskeletal:     Cervical back: Normal range of motion and neck supple.     Right lower leg: No edema.     Left lower leg: No edema.     Comments: Patellar reflexes 2+.  No ankle clonus.  Normal tone of the muscles of the lower extremities.  Skin:    General: Skin is warm and dry.  Neurological:     Mental Status: He is alert. Mental status is at baseline.  Psychiatric:        Mood and Affect: Mood normal.        Behavior: Behavior normal.     ED Results / Procedures / Treatments   Labs (all labs ordered are listed, but only abnormal results are displayed) Labs Reviewed - No data to display  EKG None  Radiology No results found.  Procedures Procedures  {Document cardiac monitor, telemetry assessment procedure when appropriate:1}  Medications Ordered in ED Medications - No data to display  ED Course/ Medical Decision Making/ A&P   {   Click here for ABCD2, HEART and other calculatorsREFRESH Note before signing :1}                              Medical Decision Making Amount and/or Complexity of Data Reviewed Labs: ordered.  Risk Prescription drug management.   ***  {Document critical care time when appropriate:1} {Document review of labs and clinical decision tools ie heart score, Chads2Vasc2 etc:1}  {Document your independent review of radiology images, and any outside records:1} {Document your discussion with family members, caretakers, and with consultants:1} {Document social determinants of health affecting pt's care:1} {Document your decision making why or why not admission, treatments were needed:1} Final Clinical Impression(s) / ED  Diagnoses Final diagnoses:  None    Rx / DC Orders ED Discharge Orders     None

## 2023-07-18 ENCOUNTER — Encounter (HOSPITAL_COMMUNITY): Payer: Self-pay | Admitting: Emergency Medicine

## 2023-07-18 ENCOUNTER — Other Ambulatory Visit: Payer: Self-pay

## 2023-07-18 ENCOUNTER — Emergency Department (HOSPITAL_COMMUNITY)
Admission: EM | Admit: 2023-07-18 | Discharge: 2023-07-19 | Disposition: A | Discharge status: Home / Self Care | Payer: Self-pay | Attending: Emergency Medicine | Admitting: Emergency Medicine

## 2023-07-18 DIAGNOSIS — N281 Cyst of kidney, acquired: Secondary | ICD-10-CM | POA: Diagnosis not present

## 2023-07-18 DIAGNOSIS — R1032 Left lower quadrant pain: Secondary | ICD-10-CM | POA: Insufficient documentation

## 2023-07-18 DIAGNOSIS — F17210 Nicotine dependence, cigarettes, uncomplicated: Secondary | ICD-10-CM | POA: Insufficient documentation

## 2023-07-18 DIAGNOSIS — N3289 Other specified disorders of bladder: Secondary | ICD-10-CM | POA: Diagnosis not present

## 2023-07-18 NOTE — ED Triage Notes (Signed)
 Pt BIB EMS from home with c/o LLQ hernia pain that started x 3 hours ago after picking up a tote.

## 2023-07-19 ENCOUNTER — Emergency Department (HOSPITAL_COMMUNITY): Payer: Self-pay

## 2023-07-19 DIAGNOSIS — N281 Cyst of kidney, acquired: Secondary | ICD-10-CM | POA: Diagnosis not present

## 2023-07-19 DIAGNOSIS — N3289 Other specified disorders of bladder: Secondary | ICD-10-CM | POA: Diagnosis not present

## 2023-07-19 LAB — COMPREHENSIVE METABOLIC PANEL WITH GFR
ALT: 25 U/L (ref 0–44)
AST: 23 U/L (ref 15–41)
Albumin: 3.4 g/dL — ABNORMAL LOW (ref 3.5–5.0)
Alkaline Phosphatase: 60 U/L (ref 38–126)
Anion gap: 11 (ref 5–15)
BUN: 20 mg/dL (ref 6–20)
CO2: 27 mmol/L (ref 22–32)
Calcium: 9.1 mg/dL (ref 8.9–10.3)
Chloride: 102 mmol/L (ref 98–111)
Creatinine, Ser: 0.93 mg/dL (ref 0.61–1.24)
GFR, Estimated: >60 mL/min (ref >60)
Glucose, Bld: 82 mg/dL (ref 70–99)
Potassium: 4.2 mmol/L (ref 3.5–5.1)
Sodium: 140 mmol/L (ref 135–145)
Total Bilirubin: 0.2 mg/dL (ref 0.0–1.2)
Total Protein: 6.3 g/dL — ABNORMAL LOW (ref 6.5–8.1)

## 2023-07-19 LAB — CBC
HCT: 37.5 % — ABNORMAL LOW (ref 39.0–52.0)
Hemoglobin: 12.4 g/dL — ABNORMAL LOW (ref 13.0–17.0)
MCH: 29.8 pg (ref 26.0–34.0)
MCHC: 33.1 g/dL (ref 30.0–36.0)
MCV: 90.1 fL (ref 80.0–100.0)
Platelets: 260 K/uL (ref 150–400)
RBC: 4.16 MIL/uL — ABNORMAL LOW (ref 4.22–5.81)
RDW: 12.8 % (ref 11.5–15.5)
WBC: 10.5 K/uL (ref 4.0–10.5)
nRBC: 0 % (ref 0.0–0.2)

## 2023-07-19 MED ORDER — OXYCODONE HCL 5 MG PO TABS
5.0000 mg | ORAL_TABLET | Freq: Once | ORAL | Status: AC
  Administered 2023-07-19: 5 mg via ORAL
  Filled 2023-07-19: qty 1

## 2023-07-19 MED ORDER — OXYCODONE HCL 5 MG PO TABS: 5.0000 mg | ORAL_TABLET | ORAL | 0 refills | Status: AC | PRN

## 2023-07-19 MED ORDER — MORPHINE SULFATE (PF) 4 MG/ML IV SOLN
4.0000 mg | Freq: Once | INTRAVENOUS | Status: AC
  Administered 2023-07-19: 4 mg via INTRAVENOUS
  Filled 2023-07-19: qty 1

## 2023-07-19 MED ORDER — IOHEXOL 300 MG/ML  SOLN
100.0000 mL | Freq: Once | INTRAMUSCULAR | Status: AC | PRN
  Administered 2023-07-19: 100 mL via INTRAVENOUS

## 2023-07-19 NOTE — ED Notes (Signed)
 Patient transported to CT

## 2023-07-19 NOTE — Discharge Instructions (Addendum)
 You were evaluated in the Emergency Department and after careful evaluation, we did not find any emergent condition requiring admission or further testing in the hospital.  Your exam/testing today is overall reassuring.  Recommend Tylenol  1000 mg every 4-6 hours and/or Motrin  600 mg every 4-6 hours for pain.  You can use the oxycodone  medication for more significant pain.  Follow-up with the surgeon if you notice bulging in that area.  Please return to the Emergency Department if you experience any worsening of your condition.   Thank you for allowing us  to be a part of your care.

## 2023-07-19 NOTE — ED Provider Notes (Signed)
 [facility]Y DEPT [facility] Emergency Department Provider Note MRN:  [id number]  Arrival date & time: 07/19/23     Chief Complaint   Hernia   History of Present Illness   [name] is a 53 y.o. year-old male with a history of chronic back pain presenting to the ED with chief complaint of hernia.  Lifting something heavy and felt severe pain to the left lower quadrant with a bulge or nodule.  Hurting ever since.  No fever, no nausea vomiting or diarrhea.  Review of Systems  A thorough review of systems was obtained and all systems are negative except as noted in the HPI and PMH.   Patient's Health History    Past Medical History:  Diagnosis Date   Abdominal pain, unspecified site    Anxiety    Backache, unspecified    Bipolar disorder, unspecified (HCC)    Chronic back pain    Crohn's    Depression    GERD (gastroesophageal reflux disease)    IBS (irritable bowel syndrome)    Other, mixed, or unspecified nondependent drug abuse, unspecified    Personal history of unspecified digestive disease    Shortness of breath    exertion   Tobacco use disorder     Past Surgical History:  Procedure Laterality Date   HERNIA REPAIR     INGUINAL HERNIA REPAIR Left 06/27/2012   Procedure: HERNIA REPAIR INGUINAL ADULT;  Surgeon: Oneil DELENA Budge, MD;  Location: [facility] OR[facility];  Service: General;  Laterality: Left;   right inguinal hernia repair      Family History  Problem Relation Age of Onset   Lung cancer Father    Cancer Father        lung   Heart disease Mother     Social History   Socioeconomic History   Marital status: Single    Spouse name: Not on file   Number of children: 0   Years of education: 9TH   Highest education level: Not on file  Occupational History   Occupation: Merchandiser, retail: UNEMPLOYED  Tobacco Use   Smoking status: Every Day    Current packs/day: 1.00    Average packs/day: 1 pack/day for 25.0 years (25.0 ttl pk-yrs)    Types: Cigarettes    Smokeless tobacco: Not on file  Substance and Sexual Activity   Alcohol use: Not Currently    Comment: none in years   Drug use: Yes    Types: Marijuana    Comment: occ   Sexual activity: Never    Birth control/protection: None  Other Topics Concern   Not on file  Social History Narrative   Not on file   Social Drivers of Health   Financial Resource Strain: Not on file  Food Insecurity: Not on file  Transportation Needs: Not on file  Physical Activity: Not on file  Stress: Not on file  Social Connections: Not on file  Intimate Partner Violence: Not on file     Physical Exam   Vitals:   07/19/23 0045 07/19/23 0100  BP: 129/84 128/83  Pulse: (!) 56 (!) 51  Resp:  17  Temp:    SpO2: 97% 96%    CONSTITUTIONAL: Well-appearing, NAD NEURO/PSYCH:  Alert and oriented x 3, no focal deficits EYES:  eyes equal and reactive ENT/NECK:  no LAD, no JVD CARDIO: Regular rate, well-perfused, normal S1 and S2 PULM:  CTAB no wheezing or rhonchi GI/GU:  non-distended, non-tender MSK/SPINE:  No gross deformities,  no edema SKIN:  no rash, atraumatic   *Additional and/or pertinent findings included in MDM below  Diagnostic and Interventional Summary    EKG Interpretation Date/Time:    Ventricular Rate:    PR Interval:    QRS Duration:    QT Interval:    QTC Calculation:   R Axis:      Text Interpretation:         Labs Reviewed  CBC - Abnormal; Notable for the following components:      Result Value   RBC 4.16 (*)    Hemoglobin 12.4 (*)    HCT 37.5 (*)    All other components within normal limits  COMPREHENSIVE METABOLIC PANEL WITH GFR - Abnormal; Notable for the following components:   Total Protein 6.3 (*)    Albumin 3.4 (*)    All other components within normal limits    CT ABDOMEN PELVIS W CONTRAST  Final Result      Medications  morphine  (PF) 4 MG/ML injection 4 mg (4 mg Intravenous Given 07/19/23 0051)  iohexol  (OMNIPAQUE ) 300 MG/ML solution 100 mL  (100 mLs Intravenous Contrast Given 07/19/23 0114)  oxyCODONE  (Oxy IR/ROXICODONE ) immediate release tablet 5 mg (5 mg Oral Given 07/19/23 0254)     Procedures  /  Critical Care Procedures  ED Course and Medical Decision Making  Initial Impression and Ddx Suspect possible hernia versus MSK related strain.  No palpable hernia on my exam.  There is a soft tissue nodule in this area but no signs of infection.  Past medical/surgical history that increases complexity of ED encounter: None  Interpretation of Diagnostics I personally reviewed the Laboratory Testing and my interpretation is as follows: No significant blood count or electrolyte disturbance.  CT unremarkable  Patient Reassessment and Ultimate Disposition/Management     Discharge  Patient management required discussion with the following services or consulting groups:  None  Complexity of Problems Addressed Acute illness or injury that poses threat of life of bodily function  Additional Data Reviewed and Analyzed Further history obtained from: Prior labs/imaging results  Additional Factors Impacting ED Encounter Risk Prescriptions, Use of parenteral controlled substances, and Consideration of hospitalization  [name]. Theadore, MD [facility] Emergency Medicine [facility] [email or web address]  Final Clinical Impressions(s) / ED Diagnoses     ICD-10-CM   1. Left lower quadrant abdominal pain  R10.32       ED Discharge Orders          Ordered    oxyCODONE  (ROXICODONE ) 5 MG immediate release tablet  Every 4 hours PRN        07/19/23 0255             Discharge Instructions Discussed with and Provided to Patient:    Discharge Instructions      You were evaluated in the Emergency Department and after careful evaluation, we did not find any emergent condition requiring admission or further testing in the hospital.  Your exam/testing today is overall reassuring.  Recommend Tylenol  1000 mg  every 4-6 hours and/or Motrin  600 mg every 4-6 hours for pain.  You can use the oxycodone  medication for more significant pain.  Follow-up with the surgeon if you notice bulging in that area.  Please return to the Emergency Department if you experience any worsening of your condition.   Thank you for allowing us  to be a part of your care.      Theadore [name], MD 07/19/23 [phone]

## 2023-09-11 ENCOUNTER — Emergency Department (HOSPITAL_COMMUNITY)
Admission: EM | Admit: 2023-09-11 | Discharge: 2023-09-11 | Disposition: A | Discharge status: Home / Self Care | Attending: Emergency Medicine | Admitting: Emergency Medicine

## 2023-09-11 ENCOUNTER — Other Ambulatory Visit: Payer: Self-pay

## 2023-09-11 ENCOUNTER — Encounter (HOSPITAL_COMMUNITY): Payer: Self-pay

## 2023-09-11 DIAGNOSIS — R1084 Generalized abdominal pain: Secondary | ICD-10-CM | POA: Diagnosis not present

## 2023-09-11 DIAGNOSIS — R1032 Left lower quadrant pain: Secondary | ICD-10-CM | POA: Diagnosis not present

## 2023-09-11 MED ORDER — CYCLOBENZAPRINE HCL 10 MG PO TABS: 10.0000 mg | ORAL_TABLET | Freq: Every day | ORAL | 0 refills | Status: DC

## 2023-09-11 MED ORDER — OXYCODONE HCL 5 MG PO TABS
10.0000 mg | ORAL_TABLET | Freq: Once | ORAL | Status: AC
  Administered 2023-09-11: 10 mg via ORAL
  Filled 2023-09-11: qty 2

## 2023-09-11 MED ORDER — CELECOXIB 200 MG PO CAPS: 200.0000 mg | ORAL_CAPSULE | Freq: Two times a day (BID) | ORAL | 0 refills | Status: AC

## 2023-09-11 MED ORDER — ACETAMINOPHEN 500 MG PO TABS: 1000.0000 mg | ORAL_TABLET | Freq: Three times a day (TID) | ORAL | 0 refills | Status: AC | PRN

## 2023-09-11 NOTE — ED Notes (Signed)
 ED Provider at bedside.

## 2023-09-11 NOTE — ED Notes (Signed)
 Pt yelling at this RN, stating what the fuck am I supposed to do, why I am even here if you're not going to give me something stronger than I take at home This RN explained that EPD sent it what medications he deemed appropriate to his pharmacy. Pt then proceeded to throw d/c papers across room while using profanities. Charge nurse aware, security called

## 2023-09-11 NOTE — ED Notes (Signed)
 Pt refusing to leave without speaking to MD about medications sent to pharm. EDP made aware and stated he was not changing what was prescribed.

## 2023-09-11 NOTE — ED Triage Notes (Signed)
 Pt states that he picked something heavy at work yesterday and is not having hernia pain.

## 2023-09-11 NOTE — ED Provider Notes (Signed)
 [place] EMERGENCY DEPARTMENT AT [facility] Provider Note   CSN: [id number] Arrival date & time: 09/11/23  0441     History No chief complaint on file.   HPI [name] is a 53 y.o. male presenting for chief complaint of groin pain. Same as last month Lifted up a heavy box yesterday Woke up this morning with pain. It is very superficial in his left lower quadrant.  Radiates into his left hip. Patient's recorded medical, surgical, social, medication list and allergies were reviewed in the Snapshot window as part of the initial history.   Review of Systems   Review of Systems  Constitutional:  Negative for chills and fever.  HENT:  Negative for ear pain and sore throat.   Eyes:  Negative for pain and visual disturbance.  Respiratory:  Negative for cough and shortness of breath.   Cardiovascular:  Negative for chest pain and palpitations.  Gastrointestinal:  Negative for abdominal pain and vomiting.  Genitourinary:  Negative for dysuria and hematuria.  Musculoskeletal:  Negative for arthralgias and back pain.  Skin:  Negative for color change and rash.  Neurological:  Negative for seizures and syncope.  All other systems reviewed and are negative.   Physical Exam Updated Vital Signs BP (!) 147/86   Pulse 67   Temp 98.3 F (36.8 C)   Resp 16   SpO2 97%  Physical Exam Vitals and nursing note reviewed.  Constitutional:      General: He is not in acute distress.    Appearance: He is well-developed.  HENT:     Head: Normocephalic and atraumatic.  Eyes:     Conjunctiva/sclera: Conjunctivae normal.  Cardiovascular:     Rate and Rhythm: Normal rate and regular rhythm.  Pulmonary:     Effort: Pulmonary effort is normal. No respiratory distress.  Abdominal:     General: Abdomen is flat. There is no distension.  Musculoskeletal:        General: No swelling or deformity.  Skin:    General: Skin is warm and dry.     Capillary Refill: Capillary refill takes  less than 2 seconds.  Neurological:     Mental Status: He is alert and oriented to person, place, and time. Mental status is at baseline.      ED Course/ Medical Decision Making/ A&P    Procedures Procedures   Medications Ordered in ED Medications  oxyCODONE  (Oxy IR/ROXICODONE ) immediate release tablet 10 mg (has no administration in time range)    Medical Decision Making:   This is a 53 year old male presenting with left groin pain.  Similar to last month.  He reports that he believes it is his hernia that is causing his pain similar to last month.  However per the notes, last month there was no palpable defect and CT scan showed no focal pathology and again today there is no palpable defect.  There is certainly no palpable hernia at this time.  He intermittently reports pain on exam though it is distractible.  He has no rebound or tenderness on the right side. Ultimately this appears to be more musculoskeletal in nature than gastrointestinal or abdominal.  Is not associated with diarrhea nor constipation.  Does not radiate down to his testicles. Will treat him as MSK pain recommend follow-up with his PCP for ongoing care and management. Consider repeat imaging but it was done so recently for the same evaluation and negative.  Vitals are stable.  Patient feels comfortable with symptom  management. Disposition:  I have considered need for hospitalization, however, considering all of the above, I believe this patient is stable for discharge at this time.  Patient/family educated about specific return precautions for given chief complaint and symptoms.  Patient/family educated about follow-up with PCP.     Patient/family expressed understanding of return precautions and need for follow-up. Patient spoken to regarding all imaging and laboratory results and appropriate follow up for these results. All education provided in verbal form with additional information in written form. Time was  allowed for answering of patient questions. Patient discharged.    Emergency Department Medication Summary:   Medications  oxyCODONE  (Oxy IR/ROXICODONE ) immediate release tablet 10 mg (has no administration in time range)        Clinical Impression:  1. Generalized abdominal pain      Discharge   Final Clinical Impression(s) / ED Diagnoses Final diagnoses:  Generalized abdominal pain    Rx / DC Orders ED Discharge Orders          Ordered    cyclobenzaprine  (FLEXERIL ) 10 MG tablet  Daily at bedtime        09/11/23 0501    acetaminophen  (TYLENOL ) 500 MG tablet  Every 8 hours PRN        09/11/23 0501    celecoxib  (CELEBREX ) 200 MG capsule  2 times daily        09/11/23 0501              Jerral Meth, MD 09/11/23 [phone]

## 2023-09-14 DIAGNOSIS — F1120 Opioid dependence, uncomplicated: Secondary | ICD-10-CM | POA: Diagnosis not present

## 2023-09-14 DIAGNOSIS — F1410 Cocaine abuse, uncomplicated: Secondary | ICD-10-CM | POA: Diagnosis not present

## 2023-09-21 DIAGNOSIS — F1410 Cocaine abuse, uncomplicated: Secondary | ICD-10-CM | POA: Diagnosis not present

## 2023-09-21 DIAGNOSIS — F1120 Opioid dependence, uncomplicated: Secondary | ICD-10-CM | POA: Diagnosis not present

## 2023-09-28 DIAGNOSIS — F1120 Opioid dependence, uncomplicated: Secondary | ICD-10-CM | POA: Diagnosis not present

## 2023-09-28 DIAGNOSIS — F1410 Cocaine abuse, uncomplicated: Secondary | ICD-10-CM | POA: Diagnosis not present

## 2023-10-05 DIAGNOSIS — F1120 Opioid dependence, uncomplicated: Secondary | ICD-10-CM | POA: Diagnosis not present

## 2023-10-05 DIAGNOSIS — F1410 Cocaine abuse, uncomplicated: Secondary | ICD-10-CM | POA: Diagnosis not present

## 2023-10-08 ENCOUNTER — Emergency Department (HOSPITAL_COMMUNITY)
Admission: EM | Admit: 2023-10-08 | Discharge: 2023-10-08 | Disposition: A | Discharge status: Home / Self Care | Attending: Emergency Medicine | Admitting: Emergency Medicine

## 2023-10-08 ENCOUNTER — Other Ambulatory Visit: Payer: Self-pay

## 2023-10-08 ENCOUNTER — Encounter (HOSPITAL_COMMUNITY): Payer: Self-pay

## 2023-10-08 ENCOUNTER — Encounter (INDEPENDENT_AMBULATORY_CARE_PROVIDER_SITE_OTHER): Payer: Self-pay | Admitting: *Deleted

## 2023-10-08 DIAGNOSIS — R1084 Generalized abdominal pain: Secondary | ICD-10-CM | POA: Insufficient documentation

## 2023-10-08 DIAGNOSIS — Z720 Tobacco use: Secondary | ICD-10-CM | POA: Diagnosis not present

## 2023-10-08 DIAGNOSIS — Z7982 Long term (current) use of aspirin: Secondary | ICD-10-CM | POA: Diagnosis not present

## 2023-10-08 LAB — COMPREHENSIVE METABOLIC PANEL WITH GFR
ALT: 19 U/L (ref 0–44)
AST: 23 U/L (ref 15–41)
Albumin: 3.6 g/dL (ref 3.5–5.0)
Alkaline Phosphatase: 60 U/L (ref 38–126)
Anion gap: 8 (ref 5–15)
BUN: 15 mg/dL (ref 6–20)
CO2: 32 mmol/L (ref 22–32)
Calcium: 8.6 mg/dL — ABNORMAL LOW (ref 8.9–10.3)
Chloride: 100 mmol/L (ref 98–111)
Creatinine, Ser: 0.65 mg/dL (ref 0.61–1.24)
GFR, Estimated: >60 mL/min (ref >60)
Glucose, Bld: 103 mg/dL — ABNORMAL HIGH (ref 70–99)
Potassium: 3.5 mmol/L (ref 3.5–5.1)
Sodium: 140 mmol/L (ref 135–145)
Total Bilirubin: 0.6 mg/dL (ref 0.0–1.2)
Total Protein: 6.4 g/dL — ABNORMAL LOW (ref 6.5–8.1)

## 2023-10-08 LAB — CBC WITH DIFFERENTIAL/PLATELET
Abs Immature Granulocytes: 0.02 K/uL (ref 0.00–0.07)
Basophils Absolute: 0 K/uL (ref 0.0–0.1)
Basophils Relative: 0 %
Eosinophils Absolute: 0.6 K/uL — ABNORMAL HIGH (ref 0.0–0.5)
Eosinophils Relative: 7 %
HCT: 35.9 % — ABNORMAL LOW (ref 39.0–52.0)
Hemoglobin: 11.7 g/dL — ABNORMAL LOW (ref 13.0–17.0)
Immature Granulocytes: 0 %
Lymphocytes Relative: 25 %
Lymphs Abs: 2 K/uL (ref 0.7–4.0)
MCH: 29.9 pg (ref 26.0–34.0)
MCHC: 32.6 g/dL (ref 30.0–36.0)
MCV: 91.8 fL (ref 80.0–100.0)
Monocytes Absolute: 0.5 K/uL (ref 0.1–1.0)
Monocytes Relative: 7 %
Neutro Abs: 4.8 K/uL (ref 1.7–7.7)
Neutrophils Relative %: 61 %
Platelets: 224 K/uL (ref 150–400)
RBC: 3.91 MIL/uL — ABNORMAL LOW (ref 4.22–5.81)
RDW: 12.7 % (ref 11.5–15.5)
WBC: 7.9 K/uL (ref 4.0–10.5)
nRBC: 0 % (ref 0.0–0.2)

## 2023-10-08 LAB — LIPASE, BLOOD: Lipase: 47 U/L (ref 11–51)

## 2023-10-08 MED ORDER — ONDANSETRON HCL 4 MG/2ML IJ SOLN
4.0000 mg | Freq: Once | INTRAMUSCULAR | Status: AC
  Administered 2023-10-08: 4 mg via INTRAVENOUS
  Filled 2023-10-08: qty 2

## 2023-10-08 MED ORDER — DICYCLOMINE HCL 10 MG PO CAPS
10.0000 mg | ORAL_CAPSULE | Freq: Once | ORAL | Status: AC
  Administered 2023-10-08: 10 mg via ORAL
  Filled 2023-10-08: qty 1

## 2023-10-08 MED ORDER — DICYCLOMINE HCL 20 MG PO TABS: 20.0000 mg | ORAL_TABLET | Freq: Two times a day (BID) | ORAL | 0 refills | Status: AC

## 2023-10-08 MED ORDER — MORPHINE SULFATE (PF) 4 MG/ML IV SOLN
4.0000 mg | Freq: Once | INTRAVENOUS | Status: AC
  Administered 2023-10-08: 4 mg via INTRAVENOUS
  Filled 2023-10-08: qty 1

## 2023-10-08 MED ORDER — OMEPRAZOLE 20 MG PO CPDR: 20.0000 mg | DELAYED_RELEASE_CAPSULE | Freq: Every day | ORAL | 0 refills | Status: AC

## 2023-10-08 MED ORDER — PANTOPRAZOLE SODIUM 40 MG IV SOLR
40.0000 mg | Freq: Once | INTRAVENOUS | Status: AC
  Administered 2023-10-08: 40 mg via INTRAVENOUS
  Filled 2023-10-08: qty 10

## 2023-10-08 NOTE — Discharge Instructions (Signed)
 You were seen today for abdominal pain.  Your workup today is reassuring including lab testing.  Trial Bentyl and omeprazole  to see if this helps.  Follow-up with gastroenterology.

## 2023-10-08 NOTE — ED Triage Notes (Signed)
 Pt to ED from home with c/o abd pain, says it feels swollen and when this happens, he also gets bad taste in his mouth, like rotten eggs pt with hx of hernia.

## 2023-10-08 NOTE — ED Provider Notes (Signed)
 [place] EMERGENCY DEPARTMENT AT [facility] Provider Note   CSN: [id number] Arrival date & time: 10/08/23  0230     Patient presents with: Abdominal Pain   [name] is a 53 y.o. male.   HPI     This is a 53 year old male who presents with abdominal pain.  Patient reports over the last 2 to 3 years she has had recurrent episodes of abdominal discomfort.  Mostly epigastric.  At times he feels like he has a bad taste in his mouth like rotten eggs.  Denies any nausea or vomiting.  Normal bowel movements.  Has not taken anything for the pain.  He describes the pain as crampy in nature.  Chart review reveals several ED visits for the same.  He has had 2 CTs in the last 12 months that have been reassuring.  He has never seen gastroenterology or had endoscopy.  Prior to Admission medications   Medication Sig Start Date End Date Taking? Authorizing Provider  dicyclomine (BENTYL) 20 MG tablet Take 1 tablet (20 mg total) by mouth 2 (two) times daily. 10/08/23  Yes Jw Covin, Charmaine FALCON, MD  omeprazole  (PRILOSEC) 20 MG capsule Take 1 capsule (20 mg total) by mouth daily. 10/08/23  Yes Bryston Colocho, Charmaine FALCON, MD  acetaminophen  (TYLENOL ) 500 MG tablet Take 2 tablets (1,000 mg total) by mouth every 8 (eight) hours as needed for mild pain (pain score 1-3) or moderate pain (pain score 4-6). 09/11/23   Countryman, Chase, MD  albuterol (PROVENTIL HFA) 108 (90 BASE) MCG/ACT inhaler Inhale 2 puffs into the lungs every 6 (six) hours as needed. for shortness of breath    [provider]  amoxicillin  (AMOXIL ) 500 MG capsule Take 1 capsule (500 mg total) by mouth 3 (three) times daily. 01/07/23   Barrett, Jamie N, PA-C  amoxicillin -clavulanate (AUGMENTIN ) 875-125 MG tablet Take 1 tablet by mouth every 12 (twelve) hours. 02/21/23   Yolande Lamar BROCKS, MD  Aspirin-Salicylamide-Caffeine [facility] HEADACHE POWDER PO) Take 1 packet by mouth daily as needed (back pain).    [provider]   celecoxib  (CELEBREX ) 200 MG capsule Take 1 capsule (200 mg total) by mouth 2 (two) times daily. 09/11/23   Jerral Meth, MD  diazepam  (VALIUM ) 5 MG tablet Take 5 mg by mouth 3 (three) times daily.    [provider]  etodolac  (LODINE ) 200 MG capsule Take 1 capsule (200 mg total) by mouth every 8 (eight) hours. Patient not taking: Reported on 04/09/2014 01/11/13   Haze Lonni PARAS, MD  oxyCODONE  (ROXICODONE ) 5 MG immediate release tablet Take 1 tablet (5 mg total) by mouth every 4 (four) hours as needed for severe pain (pain score 7-10). 07/19/23   Theadore [name], MD  polyethylene glycol (MIRALAX ) 17 g packet Take 17 g by mouth daily. 01/07/23   Barrett, Jamie N, PA-C  predniSONE  (DELTASONE ) 10 MG tablet Take 2 tablets (20 mg total) by mouth 2 (two) times daily with a meal. 04/09/14   Jamelle, [place], NP  promethazine  (PHENERGAN ) 25 MG tablet Take 1 tablet (25 mg total) by mouth every 6 (six) hours as needed for nausea. Patient not taking: Reported on 04/09/2014 03/29/12   Sheran Rogue, MD  senna-docusate (SENOKOT-S) 8.6-50 MG tablet Take 1 tablet by mouth daily. 01/07/23   Barrett, Jamie N, PA-C    Allergies: Fluoxetine hcl, Ketorolac  tromethamine , Nsaids, and Ultram  [tramadol  hcl]    Review of Systems  Constitutional:  Negative for fever.  Respiratory:  Negative for  shortness of breath.   Cardiovascular:  Negative for chest pain.  Gastrointestinal:  Positive for abdominal pain. Negative for constipation, diarrhea, nausea and vomiting.  All other systems reviewed and are negative.   Updated Vital Signs BP 108/61   Pulse 68   Temp 98.4 F (36.9 C) (Oral)   Resp 17   Ht 1.829 m (6')   Wt 70.3 kg   SpO2 97%   BMI 21.02 kg/m   Physical Exam Vitals and nursing note reviewed.  Constitutional:      Appearance: He is well-developed.  HENT:     Head: Normocephalic and atraumatic.  Eyes:     Pupils: Pupils are equal, round, and reactive to light.  Cardiovascular:     Rate  and Rhythm: Normal rate and regular rhythm.     Heart sounds: Normal heart sounds. No murmur heard. Pulmonary:     Effort: Pulmonary effort is normal. No respiratory distress.     Breath sounds: Normal breath sounds. No wheezing.  Abdominal:     General: Bowel sounds are normal.     Palpations: Abdomen is soft.     Tenderness: There is no abdominal tenderness. There is no guarding or rebound.  Musculoskeletal:     Cervical back: Neck supple.  Lymphadenopathy:     Cervical: No cervical adenopathy.  Skin:    General: Skin is warm and dry.  Neurological:     Mental Status: He is alert and oriented to person, place, and time.  Psychiatric:        Mood and Affect: Mood normal.     (all labs ordered are listed, but only abnormal results are displayed) Labs Reviewed  CBC WITH DIFFERENTIAL/PLATELET - Abnormal; Notable for the following components:      Result Value   RBC 3.91 (*)    Hemoglobin 11.7 (*)    HCT 35.9 (*)    Eosinophils Absolute 0.6 (*)    All other components within normal limits  COMPREHENSIVE METABOLIC PANEL WITH GFR - Abnormal; Notable for the following components:   Glucose, Bld 103 (*)    Calcium 8.6 (*)    Total Protein 6.4 (*)    All other components within normal limits  LIPASE, BLOOD    EKG: None  Radiology: No results found.   Procedures   Medications Ordered in the ED  morphine  (PF) 4 MG/ML injection 4 mg (4 mg Intravenous Given 10/08/23 0259)  ondansetron  (ZOFRAN ) injection 4 mg (4 mg Intravenous Given 10/08/23 0259)  pantoprazole (PROTONIX) injection 40 mg (40 mg Intravenous Given 10/08/23 0446)  dicyclomine (BENTYL) capsule 10 mg (10 mg Oral Given 10/08/23 0446)                                    Medical Decision Making Amount and/or Complexity of Data Reviewed Labs: ordered.  Risk Prescription drug management.   This patient presents to the ED for concern of abdominal pain, this involves an extensive number of treatment options, and  is a complaint that carries with it a high risk of complications and morbidity.  I considered the following differential and admission for this acute, potentially life threatening condition.  The differential diagnosis includes stratus, gastroenteritis, pancreatitis, cholecystitis, IBS  MDM:    This is a 53 year old male who presents with abdominal pain.  Similar pain in the past.  He has Crohn's and IBS listed in his history but could not give  me any historical information about whether he has ongoing management.  States he has not had endoscopy or colonoscopy by the GI doctor.  His exam is fairly benign without significant reproducible tenderness.  No signs of peritonitis.  Patient was given pain and nausea medication.  CBC and CMP are reassuring.  Given the absence of objective abdominal findings, would defer repeat CT imaging at this time.  Will start on omeprazole  and Bentyl and have him follow-up with gastroenterology.  (Labs, imaging, consults)  Labs: I Ordered, and personally interpreted labs.  The pertinent results include: CBC, CMP  Imaging Studies ordered: I ordered imaging studies including none I independently visualized and interpreted imaging. I agree with the radiologist interpretation  Additional history obtained from chart review.  External records from outside source obtained and reviewed including prior evaluations  Cardiac Monitoring: The patient was maintained on a cardiac monitor.  If on the cardiac monitor, I personally viewed and interpreted the cardiac monitored which showed an underlying rhythm of: Sinus  Reevaluation: After the interventions noted above, I reevaluated the patient and found that they have :improved  Social Determinants of Health:  lives independently  Disposition: Discharge  Co morbidities that complicate the patient evaluation  Past Medical History:  Diagnosis Date   Abdominal pain, unspecified site    Anxiety    Backache, unspecified     Bipolar disorder, unspecified (HCC)    Chronic back pain    Crohn's    Depression    GERD (gastroesophageal reflux disease)    IBS (irritable bowel syndrome)    Other, mixed, or unspecified nondependent drug abuse, unspecified    Personal history of unspecified digestive disease    Shortness of breath    exertion   Tobacco use disorder      Medicines Meds ordered this encounter  Medications   morphine  (PF) 4 MG/ML injection 4 mg   ondansetron  (ZOFRAN ) injection 4 mg   pantoprazole (PROTONIX) injection 40 mg   dicyclomine (BENTYL) capsule 10 mg   dicyclomine (BENTYL) 20 MG tablet    Sig: Take 1 tablet (20 mg total) by mouth 2 (two) times daily.    Dispense:  20 tablet    Refill:  0   omeprazole  (PRILOSEC) 20 MG capsule    Sig: Take 1 capsule (20 mg total) by mouth daily.    Dispense:  30 capsule    Refill:  0    I have reviewed the patients home medicines and have made adjustments as needed  Problem List / ED Course: Problem List Items Addressed This Visit       Other   ABDOMINAL PAIN, CHRONIC - Primary             Final diagnoses:  Generalized abdominal pain    ED Discharge Orders          Ordered    dicyclomine (BENTYL) 20 MG tablet  2 times daily        10/08/23 0520    omeprazole  (PRILOSEC) 20 MG capsule  Daily        10/08/23 0520               Bari Charmaine FALCON, MD 10/08/23 [phone]

## 2023-10-08 NOTE — ED Notes (Signed)
 Pt given something to drink and crackers for PO challenge

## 2023-10-12 DIAGNOSIS — F1120 Opioid dependence, uncomplicated: Secondary | ICD-10-CM | POA: Diagnosis not present

## 2023-10-12 DIAGNOSIS — F1410 Cocaine abuse, uncomplicated: Secondary | ICD-10-CM | POA: Diagnosis not present

## 2023-10-19 DIAGNOSIS — F1120 Opioid dependence, uncomplicated: Secondary | ICD-10-CM | POA: Diagnosis not present

## 2023-10-19 DIAGNOSIS — F1410 Cocaine abuse, uncomplicated: Secondary | ICD-10-CM | POA: Diagnosis not present

## 2023-10-26 DIAGNOSIS — F1120 Opioid dependence, uncomplicated: Secondary | ICD-10-CM | POA: Diagnosis not present

## 2023-10-26 DIAGNOSIS — F1410 Cocaine abuse, uncomplicated: Secondary | ICD-10-CM | POA: Diagnosis not present

## 2023-11-02 DIAGNOSIS — F1120 Opioid dependence, uncomplicated: Secondary | ICD-10-CM | POA: Diagnosis not present

## 2023-11-02 DIAGNOSIS — F1410 Cocaine abuse, uncomplicated: Secondary | ICD-10-CM | POA: Diagnosis not present

## 2023-11-09 DIAGNOSIS — F1410 Cocaine abuse, uncomplicated: Secondary | ICD-10-CM | POA: Diagnosis not present

## 2023-11-09 DIAGNOSIS — F1120 Opioid dependence, uncomplicated: Secondary | ICD-10-CM | POA: Diagnosis not present

## 2023-11-23 DIAGNOSIS — F1120 Opioid dependence, uncomplicated: Secondary | ICD-10-CM | POA: Diagnosis not present

## 2023-11-23 DIAGNOSIS — F1410 Cocaine abuse, uncomplicated: Secondary | ICD-10-CM | POA: Diagnosis not present

## 2023-11-30 DIAGNOSIS — F1120 Opioid dependence, uncomplicated: Secondary | ICD-10-CM | POA: Diagnosis not present

## 2023-11-30 DIAGNOSIS — F1410 Cocaine abuse, uncomplicated: Secondary | ICD-10-CM | POA: Diagnosis not present

## 2023-12-07 DIAGNOSIS — F1410 Cocaine abuse, uncomplicated: Secondary | ICD-10-CM | POA: Diagnosis not present

## 2023-12-07 DIAGNOSIS — F1120 Opioid dependence, uncomplicated: Secondary | ICD-10-CM | POA: Diagnosis not present
